# Patient Record
Sex: Female | Born: 1970 | Race: Black or African American | Hispanic: No | Marital: Single | Smoking: Never smoker
Health system: Southern US, Community
[De-identification: ages and names within clinical notes are randomized; demographics above are authoritative.]

## PROBLEM LIST (undated history)

## (undated) DIAGNOSIS — I10 Essential (primary) hypertension: Secondary | ICD-10-CM

---

## 2006-03-17 ENCOUNTER — Ambulatory Visit: Payer: Self-pay | Admitting: Internal Medicine

## 2017-02-12 ENCOUNTER — Encounter: Payer: Self-pay | Admitting: Emergency Medicine

## 2017-02-12 ENCOUNTER — Emergency Department: Payer: Medicaid Other

## 2017-02-12 ENCOUNTER — Emergency Department
Admission: EM | Admit: 2017-02-12 | Discharge: 2017-02-13 | Disposition: A | Discharge status: Home / Self Care | Payer: Medicaid Other | Attending: Emergency Medicine | Admitting: Emergency Medicine

## 2017-02-12 DIAGNOSIS — T59811A Toxic effect of smoke, accidental (unintentional), initial encounter: Secondary | ICD-10-CM

## 2017-02-12 DIAGNOSIS — J705 Respiratory conditions due to smoke inhalation: Secondary | ICD-10-CM | POA: Insufficient documentation

## 2017-02-12 DIAGNOSIS — I10 Essential (primary) hypertension: Secondary | ICD-10-CM | POA: Diagnosis not present

## 2017-02-12 HISTORY — DX: Essential (primary) hypertension: I10

## 2017-02-12 LAB — COMPREHENSIVE METABOLIC PANEL
ALT: 18 U/L (ref 14–54)
ANION GAP: 9 (ref 5–15)
AST: 22 U/L (ref 15–41)
Albumin: 4.3 g/dL (ref 3.5–5.0)
Alkaline Phosphatase: 52 U/L (ref 38–126)
BUN: 11 mg/dL (ref 6–20)
CALCIUM: 9.4 mg/dL (ref 8.9–10.3)
CHLORIDE: 108 mmol/L (ref 101–111)
CO2: 23 mmol/L (ref 22–32)
Creatinine, Ser: 0.83 mg/dL (ref 0.44–1.00)
Glucose, Bld: 123 mg/dL — ABNORMAL HIGH (ref 65–99)
Potassium: 3.5 mmol/L (ref 3.5–5.1)
SODIUM: 140 mmol/L (ref 135–145)
Total Bilirubin: 0.4 mg/dL (ref 0.3–1.2)
Total Protein: 8.3 g/dL — ABNORMAL HIGH (ref 6.5–8.1)

## 2017-02-12 LAB — CBC WITH DIFFERENTIAL/PLATELET
BASOS PCT: 1 %
Basophils Absolute: 0.1 10*3/uL (ref 0–0.1)
Eosinophils Absolute: 0.2 10*3/uL (ref 0–0.7)
Eosinophils Relative: 2 %
HEMATOCRIT: 37.9 % (ref 35.0–47.0)
HEMOGLOBIN: 12.6 g/dL (ref 12.0–16.0)
LYMPHS ABS: 1.7 10*3/uL (ref 1.0–3.6)
Lymphocytes Relative: 20 %
MCH: 26.2 pg (ref 26.0–34.0)
MCHC: 33.1 g/dL (ref 32.0–36.0)
MCV: 79.1 fL — ABNORMAL LOW (ref 80.0–100.0)
MONOS PCT: 6 %
Monocytes Absolute: 0.5 10*3/uL (ref 0.2–0.9)
NEUTROS ABS: 5.9 10*3/uL (ref 1.4–6.5)
NEUTROS PCT: 71 %
Platelets: 285 10*3/uL (ref 150–440)
RBC: 4.79 MIL/uL (ref 3.80–5.20)
RDW: 14.3 % (ref 11.5–14.5)
WBC: 8.3 10*3/uL (ref 3.6–11.0)
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
## 2017-02-13 LAB — BLOOD GAS, ARTERIAL
Acid-base deficit: 1.1 mmol/L (ref 0.0–2.0)
Bicarbonate: 23.5 mmol/L (ref 20.0–28.0)
FIO2: 0.21
LHR: 16 {breaths}/min
O2 Saturation: 99 %
PATIENT TEMPERATURE: 37
PH ART: 7.4 (ref 7.350–7.450)
PO2 ART: 131 mmHg — AB (ref 83.0–108.0)
pCO2 arterial: 38 mmHg (ref 32.0–48.0)

## 2017-02-13 LAB — COOXEMETRY PANEL
Carboxyhemoglobin: 2.2 % — ABNORMAL HIGH (ref 0.5–1.5)
METHEMOGLOBIN: 1.2 % (ref 0.0–1.5)
O2 SAT: 98.7 %
TOTAL OXYGEN CONTENT: 95 mL/dL

## 2017-02-13 NOTE — Discharge Instructions (Signed)
Your workup in the Emergency Department today was reassuring.  We did not find any severe abnormalities.  We recommend you drink plenty of fluids, take your regular medications and/or any new ones prescribed today, and follow up with the doctor(s) listed in these documents as recommended.  Return to the Emergency Department if you develop new or worsening symptoms that concern you.
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
## 2018-03-25 ENCOUNTER — Other Ambulatory Visit: Payer: Self-pay | Admitting: Family Medicine

## 2018-03-25 DIAGNOSIS — Z1231 Encounter for screening mammogram for malignant neoplasm of breast: Secondary | ICD-10-CM

## 2018-03-30 ENCOUNTER — Inpatient Hospital Stay: Admission: RE | Admit: 2018-03-30 | Payer: Self-pay | Source: Ambulatory Visit

## 2018-07-29 ENCOUNTER — Ambulatory Visit
Admission: RE | Admit: 2018-07-29 | Discharge: 2018-07-29 | Disposition: A | Discharge status: Home / Self Care | Payer: Medicaid Other | Source: Ambulatory Visit | Attending: Family Medicine | Admitting: Family Medicine

## 2018-07-29 DIAGNOSIS — Z1231 Encounter for screening mammogram for malignant neoplasm of breast: Secondary | ICD-10-CM | POA: Insufficient documentation

## 2018-07-30 ENCOUNTER — Other Ambulatory Visit: Payer: Self-pay | Admitting: Family Medicine

## 2018-07-30 DIAGNOSIS — R928 Other abnormal and inconclusive findings on diagnostic imaging of breast: Secondary | ICD-10-CM

## 2018-07-30 DIAGNOSIS — N6489 Other specified disorders of breast: Secondary | ICD-10-CM

## 2018-08-06 ENCOUNTER — Ambulatory Visit
Admission: RE | Admit: 2018-08-06 | Discharge: 2018-08-06 | Disposition: A | Discharge status: Home / Self Care | Payer: Medicaid Other | Source: Ambulatory Visit | Attending: Family Medicine | Admitting: Family Medicine

## 2018-08-06 DIAGNOSIS — N6489 Other specified disorders of breast: Secondary | ICD-10-CM

## 2018-08-06 DIAGNOSIS — R928 Other abnormal and inconclusive findings on diagnostic imaging of breast: Secondary | ICD-10-CM | POA: Diagnosis not present

## 2018-08-11 ENCOUNTER — Other Ambulatory Visit: Payer: Self-pay | Admitting: Family Medicine

## 2018-08-11 DIAGNOSIS — R928 Other abnormal and inconclusive findings on diagnostic imaging of breast: Secondary | ICD-10-CM

## 2018-08-11 DIAGNOSIS — N6310 Unspecified lump in the right breast, unspecified quadrant: Secondary | ICD-10-CM

## 2018-08-13 ENCOUNTER — Ambulatory Visit
Admission: RE | Admit: 2018-08-13 | Discharge: 2018-08-13 | Disposition: A | Discharge status: Home / Self Care | Payer: Medicaid Other | Source: Ambulatory Visit | Attending: Family Medicine | Admitting: Family Medicine

## 2018-08-13 DIAGNOSIS — R928 Other abnormal and inconclusive findings on diagnostic imaging of breast: Secondary | ICD-10-CM

## 2018-08-13 DIAGNOSIS — N6310 Unspecified lump in the right breast, unspecified quadrant: Secondary | ICD-10-CM | POA: Diagnosis present

## 2018-08-13 DIAGNOSIS — N6311 Unspecified lump in the right breast, upper outer quadrant: Secondary | ICD-10-CM | POA: Diagnosis not present

## 2018-08-13 HISTORY — PX: BREAST BIOPSY: SHX20

## 2018-08-16 LAB — SURGICAL PATHOLOGY

## 2020-01-18 ENCOUNTER — Ambulatory Visit
Admission: RE | Admit: 2020-01-18 | Discharge: 2020-01-18 | Disposition: A | Discharge status: Home / Self Care | Payer: Self-pay | Source: Ambulatory Visit | Attending: Oncology | Admitting: Oncology

## 2020-01-18 ENCOUNTER — Encounter: Payer: Self-pay | Admitting: *Deleted

## 2020-01-18 ENCOUNTER — Other Ambulatory Visit: Payer: Self-pay

## 2020-01-18 ENCOUNTER — Ambulatory Visit: Payer: Self-pay | Attending: Oncology | Admitting: *Deleted

## 2020-01-18 VITALS — BP 118/87 | HR 108 | Temp 97.8°F | Ht 61.75 in | Wt 148.4 lb

## 2020-01-18 DIAGNOSIS — N63 Unspecified lump in unspecified breast: Secondary | ICD-10-CM | POA: Insufficient documentation

## 2020-01-18 NOTE — Progress Notes (Signed)
Letter mailed from [facility] to inform patient of her normal mammogram results.  Patient is to follow-up with annual diagnostic mammogram in one year.  Patient was placed on my calendar in January to set up next [facility] / mammo appointment for June of 2022, since the breast center does not schedule out more than 6 months.

## 2020-01-18 NOTE — Progress Notes (Signed)
  Subjective:     Patient ID: Meredith Roberts, female   DOB: Jul 02, 1971, 49 y.o.   MRN: [id number]  HPI   Review of Systems     Objective:   Physical Exam Chest:     Breasts:        Right: No swelling, bleeding, inverted nipple, nipple discharge, skin change or tenderness.        Left: No swelling, bleeding, inverted nipple, mass, nipple discharge, skin change or tenderness.    Abdominal:    Lymphadenopathy:     Upper Body:     Right upper body: No supraclavicular or axillary adenopathy.     Left upper body: No supraclavicular adenopathy.  Skin:            Assessment:     49 year old Black female presents to [facility] for clinical breast exam and mammogram only.  Clinical breast exam reveals a tiny less than 0.5 cm firm nodule at 1:00 in the right areola.  Patient had right breast biopsy in 2020 that does not correlate with the area of palpable concern.  Taught self breast awareness.  Patient with a notable umbilical hernia.  She states she has an appointment in [place] for a consult about her hernia.  Last pap at her PCP in 2020.  Patient states she has another appointment for her pap smear in 3 months.  Patient has been screened for eligibility.  She does not have any insurance, Medicare or Medicaid.  She also meets financial eligibility.   Risk Assessment    Risk Scores      01/18/2020   Last edited by: Alta Corning, CMA   5-year risk: 1.7 %   Lifetime risk: 14.1 %             Plan:     Bilateral diagnostic mammogram and ultrasound ordered for follow up right breast mass and new nodule on exam.  Will follow up per [facility]CP protocol.

## 2020-01-18 NOTE — Patient Instructions (Signed)
Gave patient hand-out, Women Staying Healthy, Active and Well from [facility]CP, with education on breast health, pap smears, heart and colon health. 

## 2020-08-29 ENCOUNTER — Encounter: Payer: Self-pay | Admitting: *Deleted

## 2020-08-29 ENCOUNTER — Other Ambulatory Visit: Payer: Self-pay | Admitting: *Deleted

## 2020-08-29 DIAGNOSIS — N63 Unspecified lump in unspecified breast: Secondary | ICD-10-CM

## 2020-09-25 ENCOUNTER — Other Ambulatory Visit: Payer: Self-pay

## 2020-09-25 ENCOUNTER — Ambulatory Visit: Admission: EM | Admit: 2020-09-25 | Discharge: 2020-09-25 | Payer: Self-pay

## 2020-09-25 ENCOUNTER — Ambulatory Visit (INDEPENDENT_AMBULATORY_CARE_PROVIDER_SITE_OTHER): Payer: Self-pay

## 2020-09-25 DIAGNOSIS — R109 Unspecified abdominal pain: Secondary | ICD-10-CM

## 2020-09-25 DIAGNOSIS — R1084 Generalized abdominal pain: Secondary | ICD-10-CM | POA: Insufficient documentation

## 2020-09-25 LAB — CBC WITH DIFFERENTIAL/PLATELET
Abs Immature Granulocytes: 0.01 10*3/uL (ref 0.00–0.07)
Basophils Absolute: 0.1 10*3/uL (ref 0.0–0.1)
Basophils Relative: 1 %
Eosinophils Absolute: 0.1 10*3/uL (ref 0.0–0.5)
Eosinophils Relative: 1 %
HCT: 35.7 % — ABNORMAL LOW (ref 36.0–46.0)
Hemoglobin: 11.6 g/dL — ABNORMAL LOW (ref 12.0–15.0)
Immature Granulocytes: 0 %
Lymphocytes Relative: 21 %
Lymphs Abs: 1.3 10*3/uL (ref 0.7–4.0)
MCH: 23.5 pg — ABNORMAL LOW (ref 26.0–34.0)
MCHC: 32.5 g/dL (ref 30.0–36.0)
MCV: 72.3 fL — ABNORMAL LOW (ref 80.0–100.0)
Monocytes Absolute: 0.4 10*3/uL (ref 0.1–1.0)
Monocytes Relative: 6 %
Neutro Abs: 4.3 10*3/uL (ref 1.7–7.7)
Neutrophils Relative %: 71 %
Platelets: 304 10*3/uL (ref 150–400)
RBC: 4.94 MIL/uL (ref 3.87–5.11)
RDW: 17.4 % — ABNORMAL HIGH (ref 11.5–15.5)
WBC: 6.1 10*3/uL (ref 4.0–10.5)
nRBC: 0 % (ref 0.0–0.2)

## 2020-09-25 LAB — COMPREHENSIVE METABOLIC PANEL
ALT: 17 U/L (ref 0–44)
AST: 19 U/L (ref 15–41)
Albumin: 4.2 g/dL (ref 3.5–5.0)
Alkaline Phosphatase: 61 U/L (ref 38–126)
Anion gap: 11 (ref 5–15)
BUN: 14 mg/dL (ref 6–20)
CO2: 27 mmol/L (ref 22–32)
Calcium: 9.2 mg/dL (ref 8.9–10.3)
Chloride: 97 mmol/L — ABNORMAL LOW (ref 98–111)
Creatinine, Ser: 0.63 mg/dL (ref 0.44–1.00)
GFR, Estimated: >60 mL/min (ref >60)
Glucose, Bld: 106 mg/dL — ABNORMAL HIGH (ref 70–99)
Potassium: 3.5 mmol/L (ref 3.5–5.1)
Sodium: 135 mmol/L (ref 135–145)
Total Bilirubin: 0.4 mg/dL (ref 0.3–1.2)
Total Protein: 8.2 g/dL — ABNORMAL HIGH (ref 6.5–8.1)

## 2020-09-25 NOTE — Discharge Instructions (Addendum)
As we discussed your x-ray is concerning for possible colitis versus ileus versus small bowel obstruction and you need further evaluation and admission to the hospital.  Since your surgeons are with [facility] I am recommending you go to the emergency department at [facility] for evaluation.
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
## 2020-09-25 NOTE — ED Triage Notes (Signed)
Patient complains of lower abdominal pain that started last night. Denies diarrhea, vomiting, urinary frequency and urgency.

## 2021-01-21 NOTE — Progress Notes (Signed)
Patient pre-screened for [facility] eligibility.  Two patient identifiers used for verification that I was speaking to correct patient.  Patient to Present directly to [facility] 01/22/21 at 2:00 for [facility] diagnostic follow-up mammogram.

## 2021-01-22 ENCOUNTER — Ambulatory Visit
Admission: RE | Admit: 2021-01-22 | Discharge: 2021-01-22 | Disposition: A | Discharge status: Home / Self Care | Payer: Medicaid Other | Source: Ambulatory Visit | Attending: Oncology | Admitting: Oncology

## 2021-01-22 ENCOUNTER — Encounter: Payer: Self-pay | Admitting: *Deleted

## 2021-01-22 ENCOUNTER — Ambulatory Visit: Payer: Medicaid Other | Attending: Oncology | Admitting: *Deleted

## 2021-01-22 ENCOUNTER — Other Ambulatory Visit: Payer: Self-pay

## 2021-01-22 DIAGNOSIS — N63 Unspecified lump in unspecified breast: Secondary | ICD-10-CM

## 2021-01-22 NOTE — Progress Notes (Signed)
Patient pre-screened for [facility] by Coralee Rud, RN.  Patient presented to [facility] for 1 year follow up mammogram and ultrasound for a right breast mass.  Will follow up per [facility] protocol.

## 2021-01-23 ENCOUNTER — Encounter: Payer: Self-pay | Admitting: *Deleted

## 2021-01-23 NOTE — Progress Notes (Signed)
Letter mailed from [facility] to inform patient of her normal mammogram results.  Patient is to follow-up with annual screening in one year. 

## 2021-02-07 ENCOUNTER — Encounter: Payer: Self-pay | Admitting: *Deleted

## 2021-08-01 ENCOUNTER — Encounter (HOSPITAL_COMMUNITY): Payer: Self-pay | Admitting: Emergency Medicine

## 2021-08-01 ENCOUNTER — Emergency Department (HOSPITAL_COMMUNITY): Payer: No Typology Code available for payment source

## 2021-08-01 ENCOUNTER — Emergency Department (HOSPITAL_COMMUNITY)
Admission: EM | Admit: 2021-08-01 | Discharge: 2021-08-01 | Disposition: A | Discharge status: Home / Self Care | Payer: No Typology Code available for payment source | Attending: Emergency Medicine | Admitting: Emergency Medicine

## 2021-08-01 ENCOUNTER — Other Ambulatory Visit: Payer: Self-pay

## 2021-08-01 DIAGNOSIS — I10 Essential (primary) hypertension: Secondary | ICD-10-CM | POA: Insufficient documentation

## 2021-08-01 DIAGNOSIS — Y92410 Unspecified street and highway as the place of occurrence of the external cause: Secondary | ICD-10-CM | POA: Diagnosis not present

## 2021-08-01 DIAGNOSIS — R0789 Other chest pain: Secondary | ICD-10-CM | POA: Diagnosis not present

## 2021-08-01 DIAGNOSIS — Z79899 Other long term (current) drug therapy: Secondary | ICD-10-CM | POA: Diagnosis not present

## 2021-08-01 MED ORDER — ALBUTEROL SULFATE HFA 108 (90 BASE) MCG/ACT IN AERS: 2.0000 | INHALATION_SPRAY | RESPIRATORY_TRACT | 3 refills | Status: AC | PRN

## 2021-08-01 MED ORDER — ACETAMINOPHEN 325 MG PO TABS
650.0000 mg | ORAL_TABLET | Freq: Once | ORAL | Status: AC
  Administered 2021-08-01: 22:00:00 650 mg via ORAL
  Filled 2021-08-01: qty 2
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
## 2021-08-01 NOTE — ED Notes (Signed)
ED Provider at bedside. 

## 2021-08-01 NOTE — ED Notes (Signed)
Patient transported to X-ray 

## 2021-08-21 ENCOUNTER — Emergency Department: Payer: Self-pay

## 2021-08-21 ENCOUNTER — Other Ambulatory Visit: Payer: Self-pay

## 2021-08-21 ENCOUNTER — Encounter: Payer: Self-pay | Admitting: Emergency Medicine

## 2021-08-21 ENCOUNTER — Emergency Department
Admission: EM | Admit: 2021-08-21 | Discharge: 2021-08-21 | Disposition: A | Discharge status: Home / Self Care | Payer: Self-pay | Attending: Emergency Medicine | Admitting: Emergency Medicine

## 2021-08-21 DIAGNOSIS — D72829 Elevated white blood cell count, unspecified: Secondary | ICD-10-CM | POA: Insufficient documentation

## 2021-08-21 DIAGNOSIS — N3001 Acute cystitis with hematuria: Secondary | ICD-10-CM | POA: Insufficient documentation

## 2021-08-21 DIAGNOSIS — R109 Unspecified abdominal pain: Secondary | ICD-10-CM

## 2021-08-21 LAB — CBC
HCT: 31.8 % — ABNORMAL LOW (ref 36.0–46.0)
Hemoglobin: 10.3 g/dL — ABNORMAL LOW (ref 12.0–15.0)
MCH: 23.5 pg — ABNORMAL LOW (ref 26.0–34.0)
MCHC: 32.4 g/dL (ref 30.0–36.0)
MCV: 72.6 fL — ABNORMAL LOW (ref 80.0–100.0)
Platelets: 376 10*3/uL (ref 150–400)
RBC: 4.38 MIL/uL (ref 3.87–5.11)
RDW: 17 % — ABNORMAL HIGH (ref 11.5–15.5)
WBC: 15.7 10*3/uL — ABNORMAL HIGH (ref 4.0–10.5)
nRBC: 0 % (ref 0.0–0.2)

## 2021-08-21 LAB — URINALYSIS, ROUTINE W REFLEX MICROSCOPIC
Bilirubin Urine: NEGATIVE
Glucose, UA: NEGATIVE mg/dL
Ketones, ur: NEGATIVE mg/dL
Nitrite: NEGATIVE
Protein, ur: 30 mg/dL — AB
Specific Gravity, Urine: 1.01 (ref 1.005–1.030)
WBC, UA: >50 WBC/hpf — ABNORMAL HIGH (ref 0–5)
pH: 5 (ref 5.0–8.0)

## 2021-08-21 LAB — COMPREHENSIVE METABOLIC PANEL
ALT: 19 U/L (ref 0–44)
AST: 19 U/L (ref 15–41)
Albumin: 3.9 g/dL (ref 3.5–5.0)
Alkaline Phosphatase: 58 U/L (ref 38–126)
Anion gap: 5 (ref 5–15)
BUN: 6 mg/dL (ref 6–20)
CO2: 25 mmol/L (ref 22–32)
Calcium: 8.7 mg/dL — ABNORMAL LOW (ref 8.9–10.3)
Chloride: 105 mmol/L (ref 98–111)
Creatinine, Ser: 0.67 mg/dL (ref 0.44–1.00)
GFR, Estimated: >60 mL/min (ref >60)
Glucose, Bld: 103 mg/dL — ABNORMAL HIGH (ref 70–99)
Potassium: 3.2 mmol/L — ABNORMAL LOW (ref 3.5–5.1)
Sodium: 135 mmol/L (ref 135–145)
Total Bilirubin: 0.7 mg/dL (ref 0.3–1.2)
Total Protein: 8 g/dL (ref 6.5–8.1)

## 2021-08-21 LAB — POC URINE PREG, ED: Preg Test, Ur: NEGATIVE

## 2021-08-21 LAB — LACTIC ACID, PLASMA: Lactic Acid, Venous: 0.7 mmol/L (ref 0.5–1.9)

## 2021-08-21 LAB — LIPASE, BLOOD: Lipase: 27 U/L (ref 11–51)

## 2021-08-21 MED ORDER — NAPROXEN 500 MG PO TABS: 500.0000 mg | ORAL_TABLET | Freq: Two times a day (BID) | ORAL | 0 refills | Status: AC

## 2021-08-21 MED ORDER — KETOROLAC TROMETHAMINE 30 MG/ML IJ SOLN
15.0000 mg | Freq: Once | INTRAMUSCULAR | Status: AC
  Administered 2021-08-21: 15 mg via INTRAVENOUS
  Filled 2021-08-21: qty 1

## 2021-08-21 MED ORDER — SODIUM CHLORIDE 0.9 % IV BOLUS
1000.0000 mL | Freq: Once | INTRAVENOUS | Status: AC
  Administered 2021-08-21: 1000 mL via INTRAVENOUS

## 2021-08-21 MED ORDER — SODIUM CHLORIDE 0.9 % IV SOLN
1.0000 g | Freq: Once | INTRAVENOUS | Status: AC
Start: 2021-08-21 — End: 2021-08-21
  Administered 2021-08-21: 1 g via INTRAVENOUS
  Filled 2021-08-21: qty 10

## 2021-08-21 MED ORDER — CEPHALEXIN 500 MG PO CAPS: 500.0000 mg | ORAL_CAPSULE | Freq: Two times a day (BID) | ORAL | 0 refills | Status: AC

## 2021-08-21 NOTE — Discharge Instructions (Addendum)
Please follow-up with primary care provider of your choice in about 2 weeks to ensure that the infection is cleared.  Return to the emergency department for symptoms of change or worsen if you are unable to schedule appointment.

## 2021-08-21 NOTE — ED Notes (Addendum)
Patient Alert and oriented to baseline. Stable and ambulatory to baseline. Patient verbalized understanding of the discharge instructions.  Patient belongings were taken by the patient.   

## 2021-08-21 NOTE — ED Provider Notes (Signed)
[facility] Provider Note    Event Date/Time   First MD Initiated Contact with Patient 08/21/21 1123     (approximate)   History   Abdominal Pain   HPI  Meredith Roberts is a 51 y.o. female presents to the emergency department for treatment and evaluation of lower abdominal pain and right side flank pain.  Symptoms started today.  She denies fever.  She denies nausea, vomiting, diarrhea, she denies vaginal discharge or concern for STI.  She denies dysuria.  No previous kidney stone or frequent UTI.     Physical Exam   Triage Vital Signs: ED Triage Vitals  Enc Vitals Group     BP 08/21/21 1000 139/89     Pulse Rate 08/21/21 1000 (!) 126     Resp 08/21/21 1000 16     Temp 08/21/21 1000 98.4 F (36.9 C)     Temp Source 08/21/21 1000 Oral     SpO2 08/21/21 1000 97 %     Weight 08/21/21 0958 142 lb 12.8 oz (64.8 kg)     Height 08/21/21 0958 5\' 1"  (1.549 m)     Head Circumference --      Peak Flow --      Pain Score 08/21/21 0958 6     Pain Loc --      Pain Edu? --      Excl. in GC? --     Most recent vital signs: Vitals:   08/21/21 1243 08/21/21 1330  BP: (!) 154/97 (!) 132/57  Pulse: (!) 102   Resp: 16   Temp:    SpO2: 98%      General: Awake, no distress.  CV:  Good peripheral perfusion.  Resp:  Normal effort.  Abd:  No distention.  Other:  No CVA tenderness on exam.  Mild tenderness over the suprapubic area.  No focal tenderness of the abdomen.  No rebound tenderness.   ED Results / Procedures / Treatments   Labs (all labs ordered are listed, but only abnormal results are displayed) Labs Reviewed  COMPREHENSIVE METABOLIC PANEL - Abnormal; Notable for the following components:      Result Value   Potassium 3.2 (*)    Glucose, Bld 103 (*)    Calcium 8.7 (*)    All other components within normal limits  CBC - Abnormal; Notable for the following components:   WBC 15.7 (*)    Hemoglobin 10.3 (*)    HCT 31.8 (*)    MCV 72.6  (*)    MCH 23.5 (*)    RDW 17.0 (*)    All other components within normal limits  URINALYSIS, ROUTINE W REFLEX MICROSCOPIC - Abnormal; Notable for the following components:   Color, Urine YELLOW (*)    APPearance CLOUDY (*)    Hgb urine dipstick LARGE (*)    Protein, ur 30 (*)    Leukocytes,Ua LARGE (*)    WBC, UA >50 (*)    Bacteria, UA RARE (*)    All other components within normal limits  LIPASE, BLOOD  LACTIC ACID, PLASMA  LACTIC ACID, PLASMA  POC URINE PREG, ED     EKG  Not indicated   RADIOLOGY CT of the abdomen pelvis shows no acute intra-abdominal abnormalities.  Image and radiology report viewed by me.  PROCEDURES:  Critical Care performed: No  Procedures   MEDICATIONS ORDERED IN ED: Medications  sodium chloride 0.9 % bolus 1,000 mL (0 mLs Intravenous Stopped 08/21/21 1353)  cefTRIAXone (ROCEPHIN) 1 g in sodium chloride 0.9 % 100 mL IVPB (0 g Intravenous Stopped 08/21/21 1222)  ketorolac (TORADOL) 30 MG/ML injection 15 mg (15 mg Intravenous Given 08/21/21 1353)     IMPRESSION / MDM / ASSESSMENT AND PLAN / ED COURSE  I reviewed the triage vital signs and the nursing notes.  Differential diagnosis includes, but is not limited to, UTI, kidney stone, pyelonephritis, diverticulitis, PID  51 year old female presenting to the emergency department for treatment and evaluation of suprapubic pain and right flank pain.  See HPI for further details.  While awaiting ER room assignment, labs were drawn.  She has a leukocytosis of 15.7--Rocephin 1 g IV ordered, asymptomatic anemia with a hemoglobin of 10.3 and hematocrit of 31.8, urinalysis shows large amount of hemoglobin, protein, large leukocytes, greater than 50 white blood cells, rare bacteria.  Lipase is normal.  Vital signs show she is tachycardic at 126.  Lactic acid ordered and fluid bolus.  She is afebrile.  Respirations are normal and oxygen saturation is stable at 97% on room air.  CT renal stone study ordered  due to flank pain and large amount of hemoglobin in the urine.  CT is negative for acute concerns.  Results discussed with the patient.  Plan will be to discharge her home with a prescription for Keflex and Naprosyn.  She is to have a repeat urinalysis in about 2 weeks to make sure infection has cleared.  She is to return to the emergency department for symptoms of change or worsen if she is unable to schedule an appointment.      FINAL CLINICAL IMPRESSION(S) / ED DIAGNOSES   Final diagnoses:  Flank pain, acute  Acute cystitis with hematuria     Rx / DC Orders   ED Discharge Orders          Ordered    cephALEXin (KEFLEX) 500 MG capsule  2 times daily        08/21/21 1340    naproxen (NAPROSYN) 500 MG tablet  2 times daily with meals        08/21/21 1340             Note:  This document was prepared using Dragon voice recognition software and may include unintentional dictation errors.   Victorino Dike, FNP 08/21/21 1609    Naaman Plummer, MD 08/22/21 [phone]

## 2021-08-21 NOTE — ED Triage Notes (Signed)
Pt comes into the ED via POV c/o lower abd pain that started today.  Pt denies any N/V/D.  Pt in NAD at this time with even and unlabored respirations.

## 2021-09-10 IMAGING — US US BREAST*R* LIMITED INC AXILLA
1 series · 8 of 8 positions shown · non-contrast
Comparison: Previous exam(s).

CLINICAL DATA: Two year followup for probably benign masses in the
RIGHT breast. Patient had ultrasound-guided core biopsy of mass in
the 10 o'clock location of the RIGHT breast 3 centimeters from the
nipple on 08/13/2018, suggesting complex fibroadenoma. Two
additional benign masses have been followed.

EXAM:
DIGITAL DIAGNOSTIC BILATERAL MAMMOGRAM WITH TOMOSYNTHESIS AND CAD;
ULTRASOUND RIGHT BREAST LIMITED
TECHNIQUE: Bilateral digital diagnostic mammography and breast tomosynthesis
was performed. The images were evaluated with computer-aided
detection.; Targeted ultrasound examination of the right breast was
performed

[Series 1: us breast*right* limited inc axilla · 0.05mm/px · 8 of 8 slices shown]
[im 1/8]
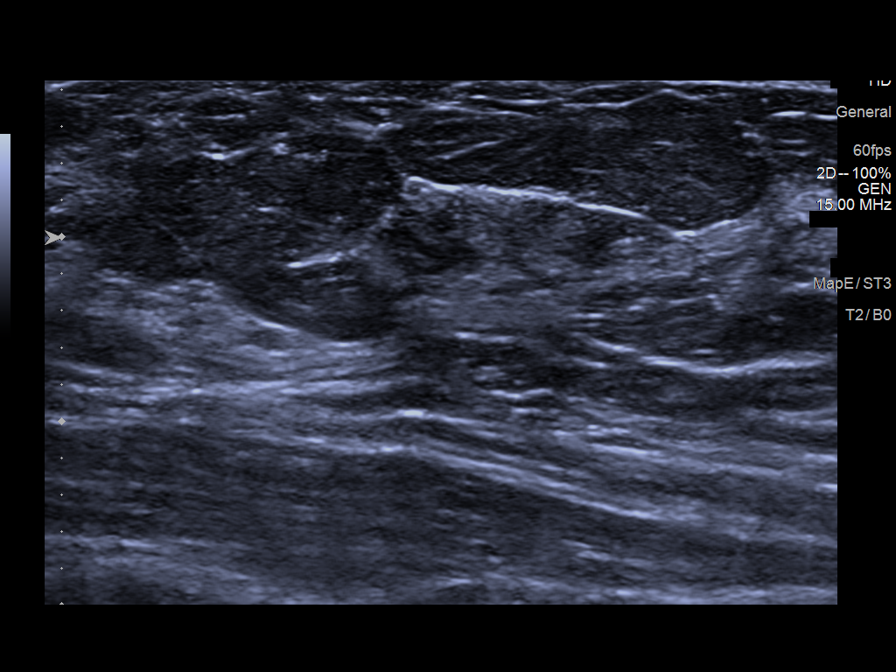
[im 2/8]
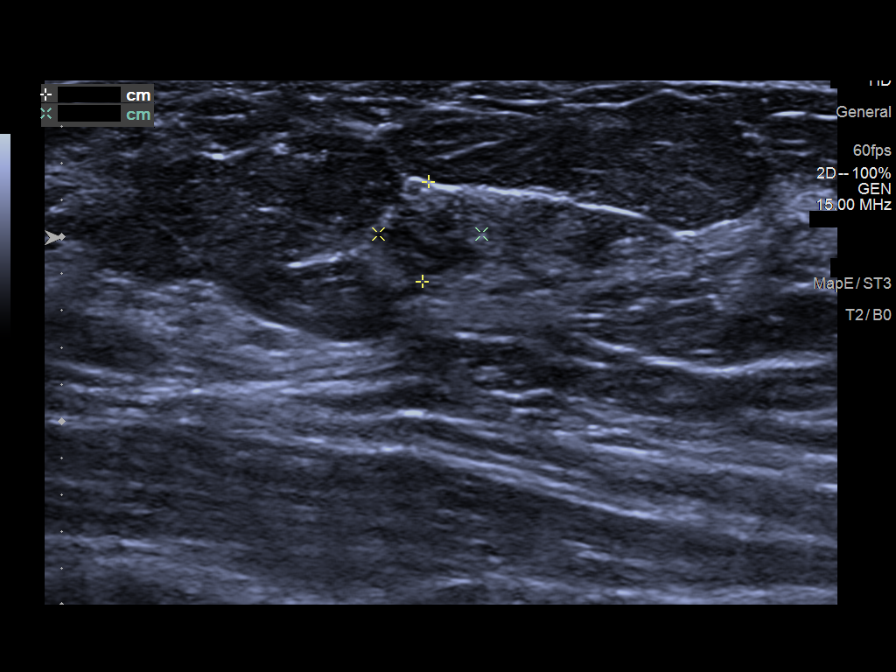
[im 3/8]
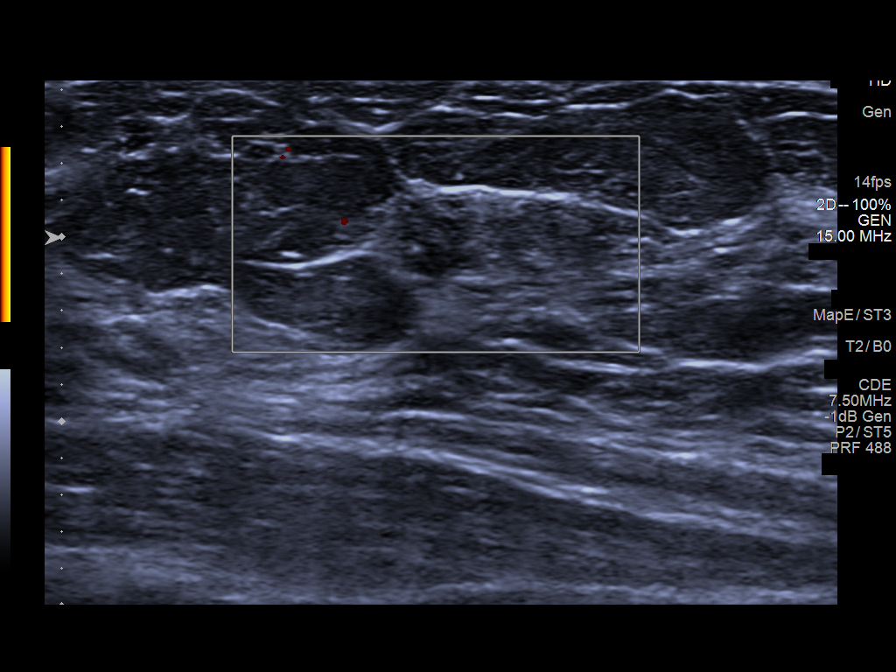
[im 4/8]
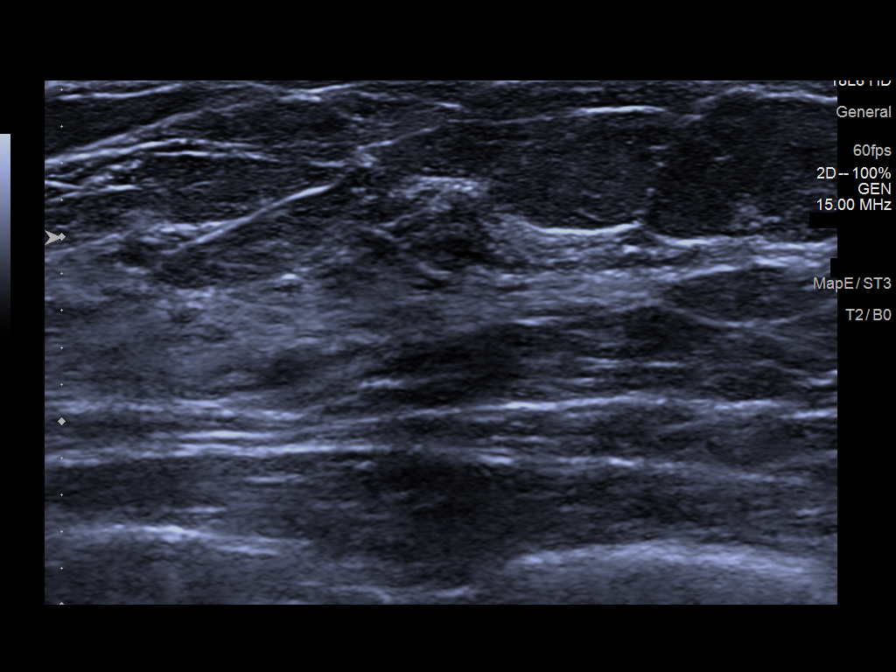
[im 5/8]
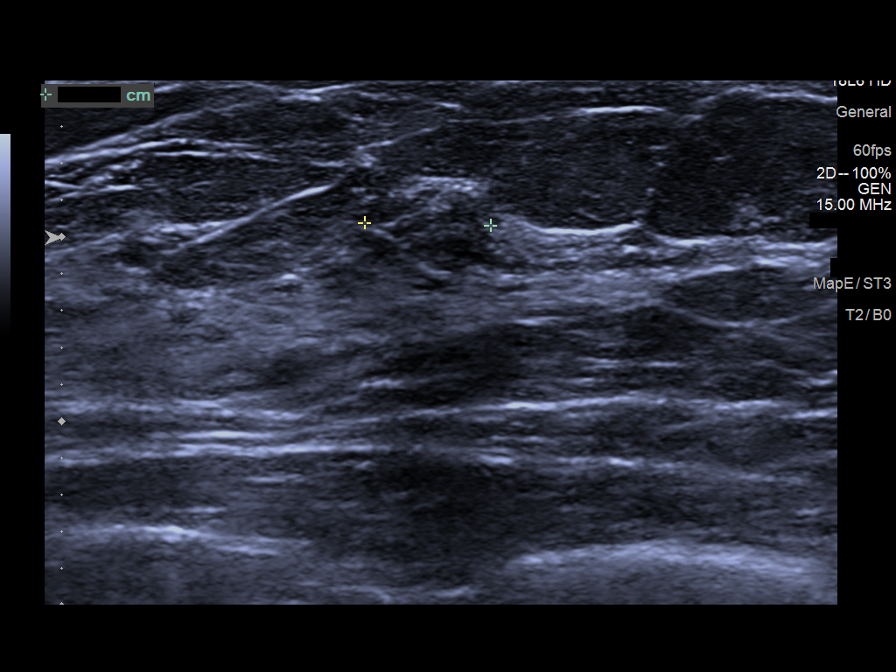
[im 6/8]
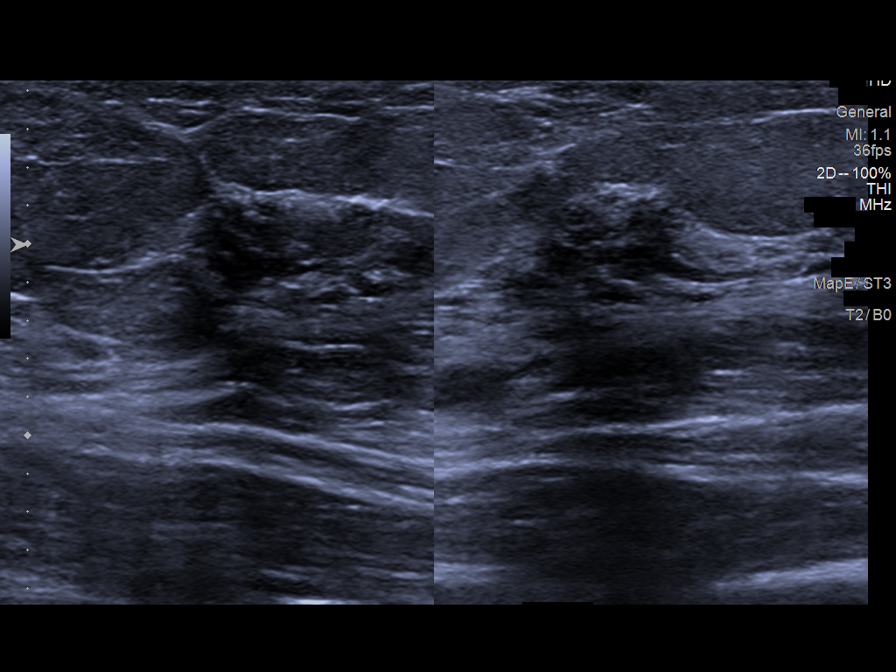
[im 7/8]
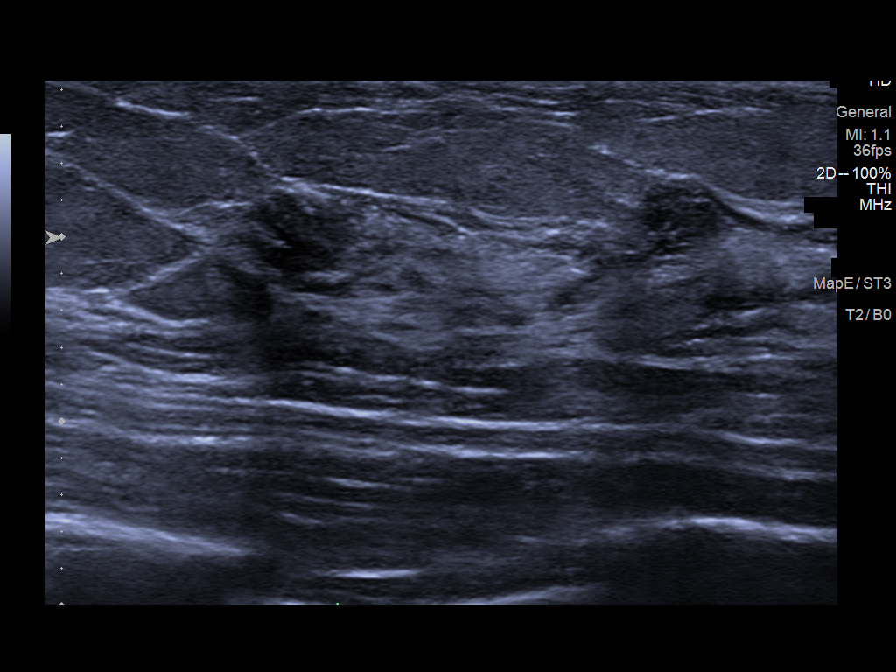
[im 8/8]
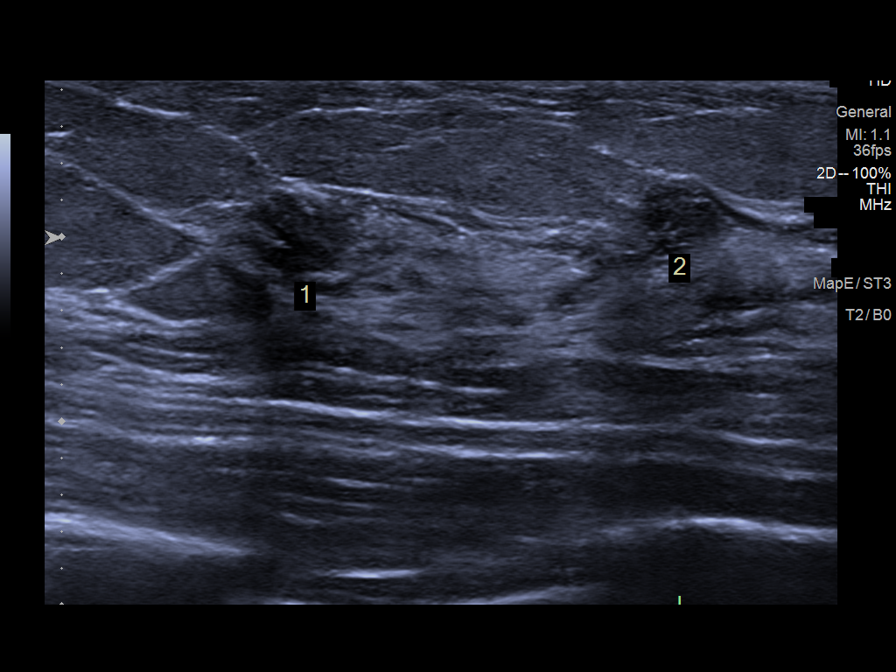

[8 of 8 positions shown; findings below may reference images not displayed]

ACR Breast Density Category c: The breast tissue is heterogeneously
dense, which may obscure small masses.
FINDINGS: Tissue marker clip is identified in the LATERAL portion of the RIGHT
breast. No suspicious mass, distortion, or microcalcifications are
identified in either breast to suggest presence of malignancy.

Targeted ultrasound is performed, showing a circumscribed oval
isoechoic mass in the 10 o'clock location of the RIGHT breast 3
centimeters from the nipple, measuring 0.7 x 0.6 x 0.5 centimeters.
Previously, mass measured 0.8 x 0.5 x 0.7 centimeters. An adjacent
benign-appearing hypoechoic mass is 4 millimeters in diameter. The
mass previously noted in the 12 o'clock location of the RIGHT breast
is not seen on today's exam.
IMPRESSION: 1.  No mammographic or ultrasound evidence for malignancy.
2. Long-term stability of RIGHT breast masses, consistent with
benign process.

RECOMMENDATION:
Screening mammogram in one year.(Code:[id number]-E-2N4)

I have discussed the findings and recommendations with the patient.
If applicable, a reminder letter will be sent to the patient
regarding the next appointment.

BI-RADS CATEGORY  2: Benign.

## 2021-12-02 ENCOUNTER — Other Ambulatory Visit: Payer: Self-pay

## 2021-12-02 ENCOUNTER — Emergency Department
Admission: EM | Admit: 2021-12-02 | Discharge: 2021-12-02 | Disposition: A | Discharge status: Home / Self Care | Payer: Medicaid Other | Attending: Emergency Medicine | Admitting: Emergency Medicine

## 2021-12-02 ENCOUNTER — Emergency Department: Payer: Medicaid Other

## 2021-12-02 DIAGNOSIS — I10 Essential (primary) hypertension: Secondary | ICD-10-CM | POA: Insufficient documentation

## 2021-12-02 DIAGNOSIS — G43909 Migraine, unspecified, not intractable, without status migrainosus: Secondary | ICD-10-CM

## 2021-12-02 DIAGNOSIS — R519 Headache, unspecified: Secondary | ICD-10-CM | POA: Insufficient documentation

## 2021-12-02 MED ORDER — METOCLOPRAMIDE HCL 5 MG/ML IJ SOLN
10.0000 mg | Freq: Once | INTRAMUSCULAR | Status: AC
Start: 2021-12-02 — End: 2021-12-02
  Administered 2021-12-02: 10 mg via INTRAVENOUS
  Filled 2021-12-02: qty 2

## 2021-12-02 MED ORDER — DIPHENHYDRAMINE HCL 50 MG/ML IJ SOLN
50.0000 mg | Freq: Once | INTRAMUSCULAR | Status: AC
  Administered 2021-12-02: 50 mg via INTRAVENOUS
  Filled 2021-12-02: qty 1

## 2021-12-02 MED ORDER — KETOROLAC TROMETHAMINE 30 MG/ML IJ SOLN
30.0000 mg | Freq: Once | INTRAMUSCULAR | Status: AC
Start: 2021-12-02 — End: 2021-12-02
  Administered 2021-12-02: 30 mg via INTRAVENOUS
  Filled 2021-12-02: qty 1

## 2021-12-02 MED ORDER — SODIUM CHLORIDE 0.9 % IV BOLUS
1000.0000 mL | Freq: Once | INTRAVENOUS | Status: AC
Start: 2021-12-02 — End: 2021-12-02
  Administered 2021-12-02: 1000 mL via INTRAVENOUS

## 2021-12-02 MED ORDER — BUTALBITAL-APAP-CAFFEINE 50-325-40 MG PO TABS
1.0000 | ORAL_TABLET | Freq: Four times a day (QID) | ORAL | 0 refills | Status: AC | PRN
Start: 2021-12-02 — End: 2022-12-02

## 2021-12-02 NOTE — ED Triage Notes (Signed)
Pt c/o HA since Friday,  states she has been taking IBU with some relief. Pt is in NAD at present. Ambulatory with a steady gait ?

## 2021-12-02 NOTE — ED Provider Notes (Signed)
? ?[facility] ?Provider Note ? ? ? Event Date/Time  ? First MD Initiated Contact with Patient 12/02/21 1055   ?  (approximate) ? ?History  ? ?Chief Complaint: Headache ? ?HPI ? ?Meredith Roberts is a 51 y.o. female with a past medical history of hypertension who presents to the emergency department for a headache.  According to the patient for the past 2 days or so she has been experiencing headache along the right side of her head.  Patient denies any photo phonophobia denies any weakness or numbness of any arm or leg confusion or difficulty speaking or thinking.  Patient denies any history of significant headaches previously currently rates her headache as moderate to severe.  Patient describes more of a progressive onset of the headache but states the headache is not gone away in 2 days. ? ?Physical Exam  ? ?Triage Vital Signs: ?ED Triage Vitals  ?Enc Vitals Group  ?   BP 12/02/21 1024 (!) 160/90  ?   Pulse Rate 12/02/21 1024 73  ?   Resp 12/02/21 1024 17  ?   Temp 12/02/21 1024 98.4 ?F (36.9 ?C)  ?   Temp Source 12/02/21 1024 Oral  ?   SpO2 12/02/21 1024 99 %  ?   Weight 12/02/21 1025 141 lb (64 kg)  ?   Height 12/02/21 1025 5\' 2"  (1.575 m)  ?   Head Circumference --   ?   Peak Flow --   ?   Pain Score 12/02/21 1025 0  ?   Pain Loc --   ?   Pain Edu? --   ?   Excl. in GC? --   ? ? ?Most recent vital signs: ?Vitals:  ? 12/02/21 1024  ?BP: (!) 160/90  ?Pulse: 73  ?Resp: 17  ?Temp: 98.4 ?F (36.9 ?C)  ?SpO2: 99%  ? ? ?General: Awake, no distress.  ?CV:  Good peripheral perfusion.  Regular rate and rhythm  ?Resp:  Normal effort.  Equal breath sounds bilaterally.  ?Abd:  No distention.  Soft, nontender.  No rebound or guarding. ?Other:  Equal grip strength bilaterally.  No concerning findings on physical neurological exam ? ? ?ED Results / Procedures / Treatments  ? ?RADIOLOGY ? ?Personally evaluated the CT imaging, no concerning findings of my evaluation. ?Radiology is read the CT is  negative. ? ? ?MEDICATIONS ORDERED IN ED: ?Medications  ?ketorolac (TORADOL) 30 MG/ML injection 30 mg (has no administration in time range)  ?metoCLOPramide (REGLAN) injection 10 mg (has no administration in time range)  ?diphenhydrAMINE (BENADRYL) injection 50 mg (has no administration in time range)  ? ? ? ?IMPRESSION / MDM / ASSESSMENT AND PLAN / ED COURSE  ?I reviewed the triage vital signs and the nursing notes. ? ?Patient presents emergency department for headache x2 days with no significant headache history.  Overall the patient appears well reassuring physical and neurological exam.  We will obtain CT scan of the head to rule out intracranial mass, bleed, etc. as the patient has no history of headaches.  We will treat with a migraine cocktail of medications Toradol, Reglan, Benadryl and IV fluids.  We will reassess after imaging and cocktail administration. ? ?CT scan is negative.  Patient states her headache is improving after medications.  Patient has approximately 750 cc of normal saline remaining.  We will finish out the fluids and discharge patient with Fioricet have patient follow-up with her doctor.  Patient agreeable to plan of care. ? ?FINAL  CLINICAL IMPRESSION(S) / ED DIAGNOSES  ? ?Headache ? ? ?Note:  This document was prepared using Dragon voice recognition software and may include unintentional dictation errors. ?  Minna Antis, MD ?12/02/21 1203 ? ?

## 2022-01-06 ENCOUNTER — Emergency Department
Admission: EM | Admit: 2022-01-06 | Discharge: 2022-01-06 | Disposition: A | Discharge status: Home / Self Care | Payer: Medicaid Other | Attending: Emergency Medicine | Admitting: Emergency Medicine

## 2022-01-06 ENCOUNTER — Encounter: Payer: Self-pay | Admitting: Emergency Medicine

## 2022-01-06 ENCOUNTER — Other Ambulatory Visit: Payer: Self-pay

## 2022-01-06 DIAGNOSIS — N3000 Acute cystitis without hematuria: Secondary | ICD-10-CM | POA: Insufficient documentation

## 2022-01-06 DIAGNOSIS — E860 Dehydration: Secondary | ICD-10-CM | POA: Insufficient documentation

## 2022-01-06 DIAGNOSIS — R42 Dizziness and giddiness: Secondary | ICD-10-CM

## 2022-01-06 DIAGNOSIS — I10 Essential (primary) hypertension: Secondary | ICD-10-CM | POA: Insufficient documentation

## 2022-01-06 DIAGNOSIS — Z79899 Other long term (current) drug therapy: Secondary | ICD-10-CM | POA: Insufficient documentation

## 2022-01-06 LAB — COMPREHENSIVE METABOLIC PANEL
ALT: 32 U/L (ref 0–44)
AST: 25 U/L (ref 15–41)
Albumin: 3.9 g/dL (ref 3.5–5.0)
Alkaline Phosphatase: 53 U/L (ref 38–126)
Anion gap: 8 (ref 5–15)
BUN: 11 mg/dL (ref 6–20)
CO2: 24 mmol/L (ref 22–32)
Calcium: 9.2 mg/dL (ref 8.9–10.3)
Chloride: 104 mmol/L (ref 98–111)
Creatinine, Ser: 0.48 mg/dL (ref 0.44–1.00)
GFR, Estimated: >60 mL/min (ref >60)
Glucose, Bld: 94 mg/dL (ref 70–99)
Potassium: 3.4 mmol/L — ABNORMAL LOW (ref 3.5–5.1)
Sodium: 136 mmol/L (ref 135–145)
Total Bilirubin: 0.4 mg/dL (ref 0.3–1.2)
Total Protein: 8.8 g/dL — ABNORMAL HIGH (ref 6.5–8.1)

## 2022-01-06 LAB — CBC WITH DIFFERENTIAL/PLATELET
Abs Immature Granulocytes: 0.05 10*3/uL (ref 0.00–0.07)
Basophils Absolute: 0 10*3/uL (ref 0.0–0.1)
Basophils Relative: 1 %
Eosinophils Absolute: 0 10*3/uL (ref 0.0–0.5)
Eosinophils Relative: 0 %
HCT: 33.7 % — ABNORMAL LOW (ref 36.0–46.0)
Hemoglobin: 10.8 g/dL — ABNORMAL LOW (ref 12.0–15.0)
Immature Granulocytes: 1 %
Lymphocytes Relative: 11 %
Lymphs Abs: 0.9 10*3/uL (ref 0.7–4.0)
MCH: 22.9 pg — ABNORMAL LOW (ref 26.0–34.0)
MCHC: 32 g/dL (ref 30.0–36.0)
MCV: 71.4 fL — ABNORMAL LOW (ref 80.0–100.0)
Monocytes Absolute: 0.8 10*3/uL (ref 0.1–1.0)
Monocytes Relative: 9 %
Neutro Abs: 6.8 10*3/uL (ref 1.7–7.7)
Neutrophils Relative %: 78 %
Platelets: 305 10*3/uL (ref 150–400)
RBC: 4.72 MIL/uL (ref 3.87–5.11)
RDW: 17.4 % — ABNORMAL HIGH (ref 11.5–15.5)
WBC: 8.6 10*3/uL (ref 4.0–10.5)
nRBC: 0 % (ref 0.0–0.2)

## 2022-01-06 LAB — URINALYSIS, ROUTINE W REFLEX MICROSCOPIC
Bilirubin Urine: NEGATIVE
Glucose, UA: NEGATIVE mg/dL
Ketones, ur: 5 mg/dL — AB
Nitrite: NEGATIVE
Protein, ur: 30 mg/dL — AB
Specific Gravity, Urine: 1.014 (ref 1.005–1.030)
WBC, UA: >50 WBC/hpf — ABNORMAL HIGH (ref 0–5)
pH: 5 (ref 5.0–8.0)

## 2022-01-06 LAB — TROPONIN I (HIGH SENSITIVITY)
Troponin I (High Sensitivity): 4 ng/L (ref <18)
Troponin I (High Sensitivity): 3 ng/L (ref <18)

## 2022-01-06 LAB — POC URINE PREG, ED: Preg Test, Ur: NEGATIVE

## 2022-01-06 MED ORDER — POTASSIUM CHLORIDE CRYS ER 20 MEQ PO TBCR
EXTENDED_RELEASE_TABLET | ORAL | Status: AC
  Administered 2022-01-06: 40 meq via ORAL
  Filled 2022-01-06: qty 2

## 2022-01-06 MED ORDER — SODIUM CHLORIDE 0.9 % IV BOLUS
1000.0000 mL | Freq: Once | INTRAVENOUS | Status: AC
Start: 2022-01-06 — End: 2022-01-06
  Administered 2022-01-06: 1000 mL via INTRAVENOUS

## 2022-01-06 MED ORDER — POTASSIUM CHLORIDE CRYS ER 20 MEQ PO TBCR: 40.0000 meq | EXTENDED_RELEASE_TABLET | Freq: Once | ORAL | Status: AC

## 2022-01-06 MED ORDER — CEPHALEXIN 500 MG PO CAPS: 500.0000 mg | ORAL_CAPSULE | Freq: Two times a day (BID) | ORAL | 0 refills | Status: AC

## 2022-01-06 NOTE — ED Triage Notes (Signed)
Pt in with co dizziness since this am, no loc. NO hx of the same states started at 0800 today.

## 2022-01-06 NOTE — ED Provider Notes (Signed)
[facility] Provider Note    Event Date/Time   First MD Initiated Contact with Patient 01/06/22 1106     (approximate)   History   Dizziness   HPI  Meredith Roberts is a 51 y.o. female  with episode of sweating and dizzyness around 830AM. She did not fall or faint. She was able to rest and recover. Denies this ever happening previously.  No vision changes, sob, chest pain or nausea or vomiting. Reports some HTN which she takes atenolol. Did not take this morning.  She reports missing breakfast and then having some water and crackles and feeling better.   Physical Exam   Triage Vital Signs: ED Triage Vitals [01/06/22 1045]  Enc Vitals Group     BP (!) 147/91     Pulse Rate (!) 111     Resp 20     Temp 98.1 F (36.7 C)     Temp Source Oral     SpO2 100 %     Weight 145 lb (65.8 kg)     Height 5\' 1"  (1.549 m)     Head Circumference      Peak Flow      Pain Score 0     Pain Loc      Pain Edu?      Excl. in GC?     Most recent vital signs: Vitals:   01/06/22 1045  BP: (!) 147/91  Pulse: (!) 111  Resp: 20  Temp: 98.1 F (36.7 C)  SpO2: 100%     General: Awake, no distress.  CV:  Good peripheral perfusion.  Resp:  Normal effort.  Abd:  No distention.  Soft and nontender Other:  Cranial nerves II to XII are intact.  Finger-nose intact bilaterally. No swelling in her legs.  No calf tenderness  ED Results / Procedures / Treatments   Labs (all labs ordered are listed, but only abnormal results are displayed) Labs Reviewed  CBC WITH DIFFERENTIAL/PLATELET  COMPREHENSIVE METABOLIC PANEL  URINALYSIS, ROUTINE W REFLEX MICROSCOPIC  TROPONIN I (HIGH SENSITIVITY)     EKG  My interpretation of EKG:  Sinus tachy rate of 108, no st elevation, no twi, normal intervals   RADIOLOGY none  PROCEDURES:  Critical Care performed: No  Procedures   MEDICATIONS ORDERED IN ED: Medications  sodium chloride 0.9 % bolus 1,000 mL (1,000 mLs  Intravenous New Bag/Given 01/06/22 1141)  potassium chloride SA (KLOR-CON M) CR tablet 40 mEq (40 mEq Oral Given 01/06/22 1225)     IMPRESSION / MDM / ASSESSMENT AND PLAN / ED COURSE  I reviewed the triage vital signs and the nursing notes.   Patient's presentation is most consistent with acute, uncomplicated illness.   Patient comes in with tachycardia with near syncopal symptoms. Differential:  EKG, cardiac markers to evaluate for ACS, arrhythmia, electrolyte abnormalities, UTI.  Her cranial nerves appear intact and she is good finger-to-nose and doubt that this is represents a stroke.  This could also just be dehydration given she reports not eating or drinking this morning prior to going to work.  She denies any calf tenderness or shortness of breath to suggest PE  Patient given a liter of fluid for tachycardia and some potassium for low potassium  Tropes negative x2.  UA concerning for UTI she does report some increased urinary frequency.  We will send for culture.  1:09 PM I rechecked the heart rate myself prior to discharge and her heart rate was 94.  She reports resolution of symptoms.  She feels comfortable with discharge home at this time and was sent home with some antibiotics and increasing p.o. intake    FINAL CLINICAL IMPRESSION(S) / ED DIAGNOSES   Final diagnoses:  Dizziness  Dehydration  Acute cystitis without hematuria     Rx / DC Orders   ED Discharge Orders          Ordered    cephALEXin (KEFLEX) 500 MG capsule  2 times daily        01/06/22 1310             Note:  This document was prepared using Dragon voice recognition software and may include unintentional dictation errors.   Concha Se, MD 01/06/22 [phone]

## 2022-01-06 NOTE — Discharge Instructions (Signed)
Make sure to stay well-hydrated and do not miss breakfast in the morning prior to going to work.  Take the antibiotics for UTI and return if you develop fevers, worsening dizziness or any other concerns

## 2022-01-06 NOTE — ED Provider Triage Note (Signed)
Emergency Medicine Provider Triage Evaluation Note  Meredith Roberts , a 51 y.o. female  was evaluated in triage.  Pt complains of dizzy this morning better and drinking and eating. No dizzyness now   Review of Systems  Positive: dizzy Negative: No vision changes   Physical Exam  BP (!) 147/91 (BP Location: Left Arm)   Pulse (!) 111   Temp 98.1 F (36.7 C) (Oral)   Resp 20   SpO2 100%  Gen:   Awake, no distress   Resp:  Normal effort  MSK:   Moves extremities without difficulty  Other:  CN intact. Finger to nose intact   Medical Decision Making  Medically screening exam initiated at 10:46 AM.  Appropriate orders placed.  Chase Caller was informed that the remainder of the evaluation will be completed by another provider, this initial triage assessment does not replace that evaluation, and the importance of remaining in the ED until their evaluation is complete.  NIHSS Zero. Unlikely stroke. Labs     Concha Se, MD 01/06/22 1047

## 2022-01-09 LAB — URINE CULTURE: Culture: >=100000 — AB

## 2022-04-09 IMAGING — CT CT RENAL STONE PROTOCOL
2 of 4 series · 16 of 46 positions shown, 18 images · non-contrast
Comparison: None.

CLINICAL DATA: Lower abdominal pain.



[Series 2: stone full standard · axial · 0.61mm/px · z∈[-865,-480]mm · 13 of 85 slices shown, 15 images]
[im 4/85  soft-tissue]
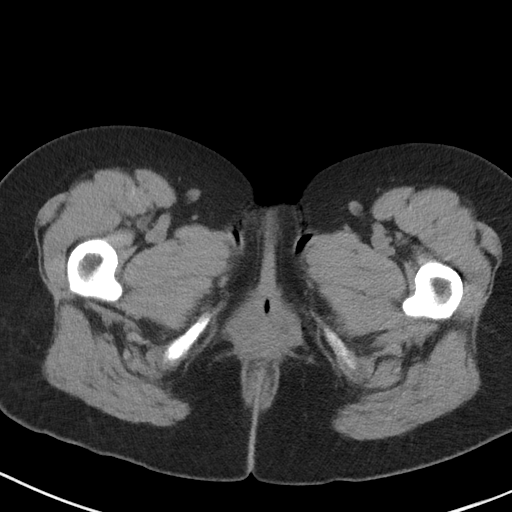
[im 4/85  bone]
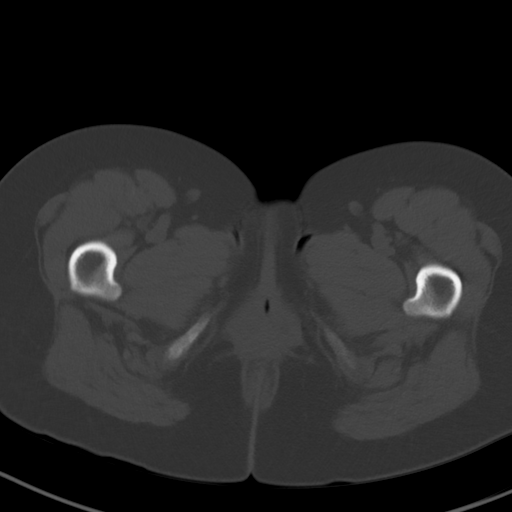
[im 11/85  soft-tissue]
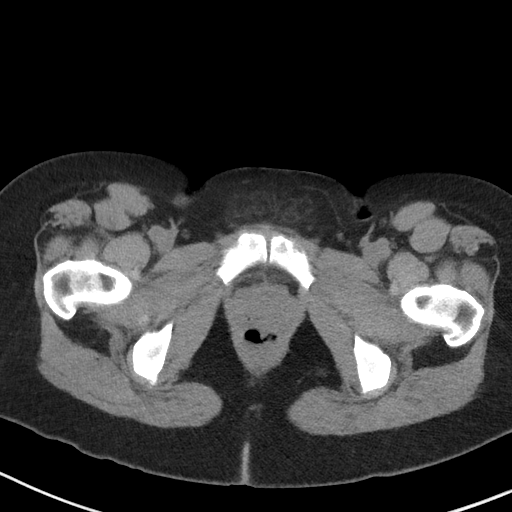
[im 18/85  soft-tissue]
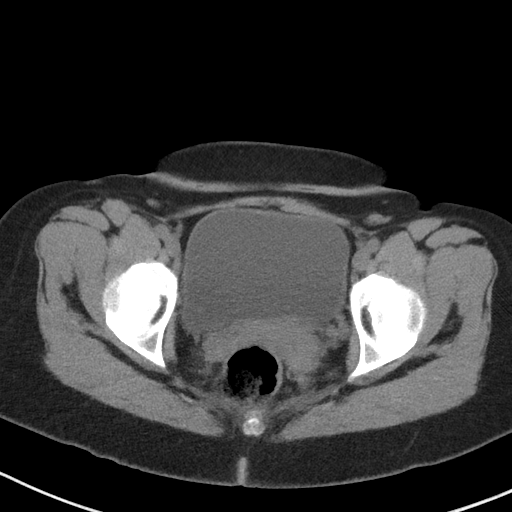
[im 25/85  soft-tissue]
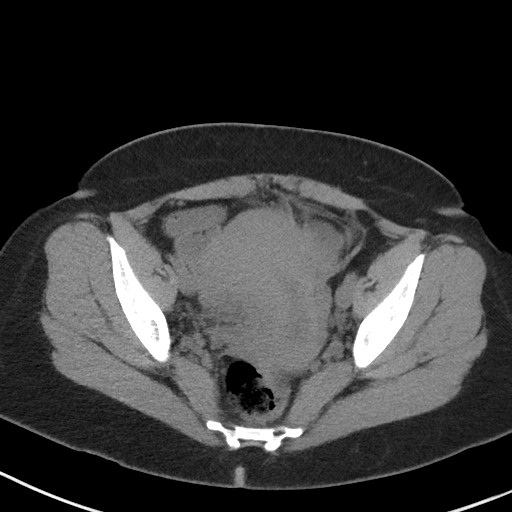
[im 29/85  soft-tissue]
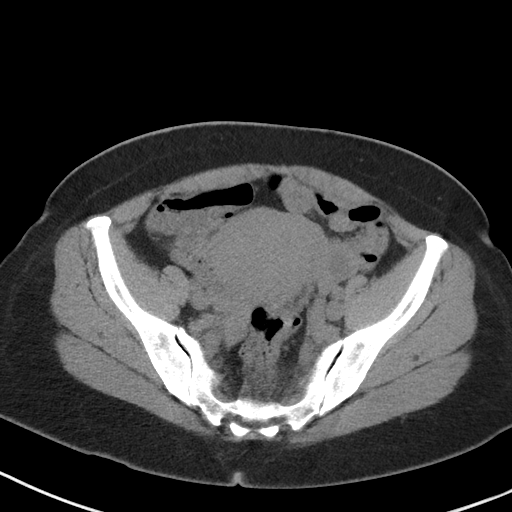
[im 36/85  soft-tissue]
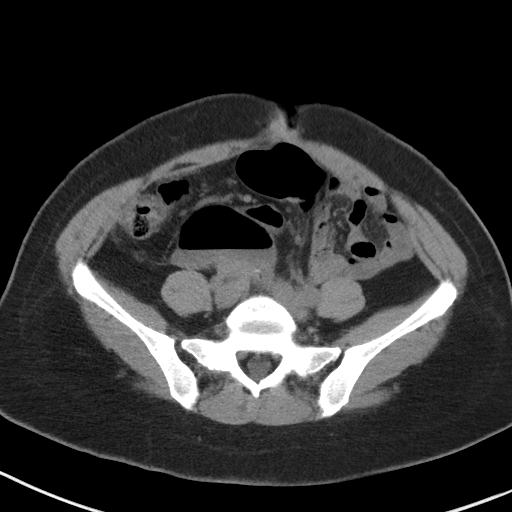
[im 43/85  soft-tissue]
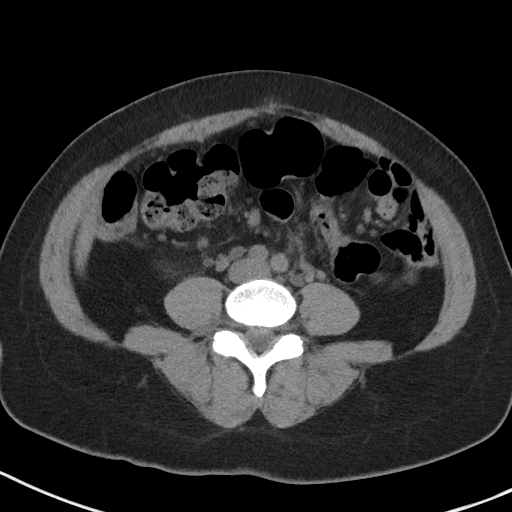
[im 50/85  soft-tissue]
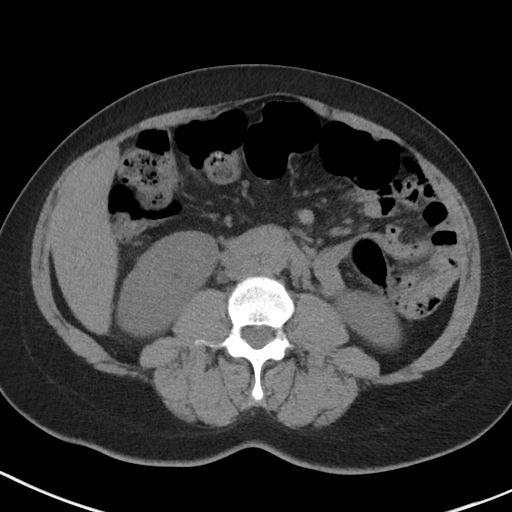
[im 57/85  soft-tissue]
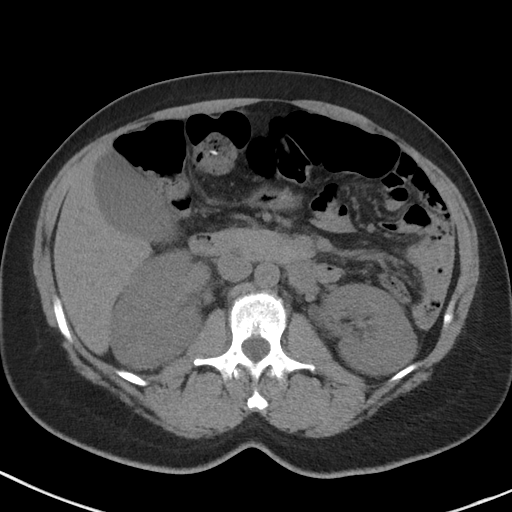
[im 57/85  bone]
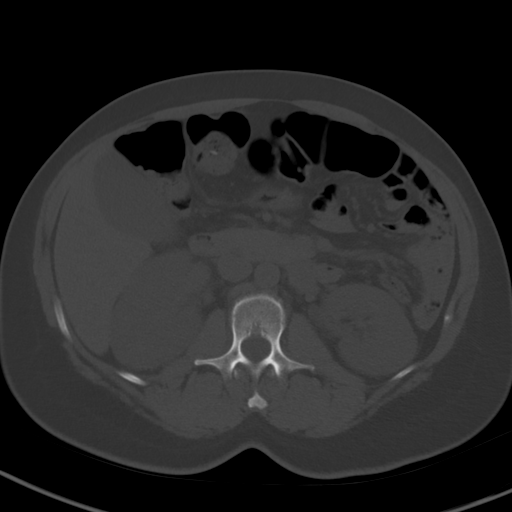
[im 60/85  soft-tissue]
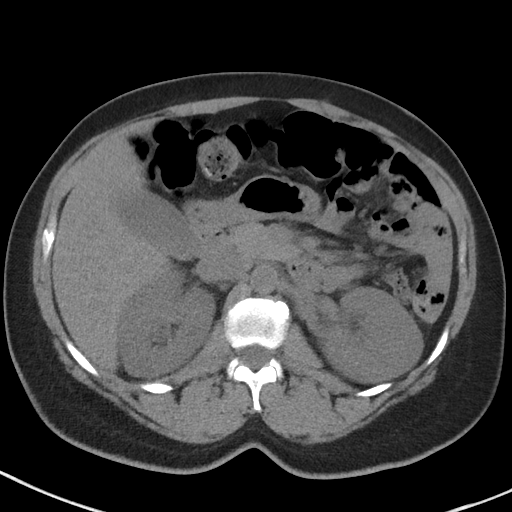
[im 67/85  soft-tissue]
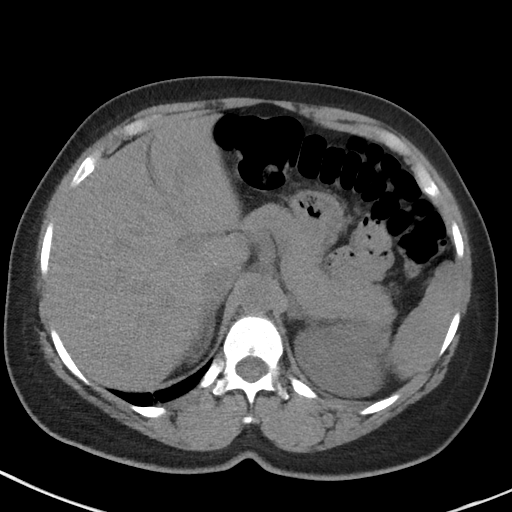
[im 74/85  soft-tissue]
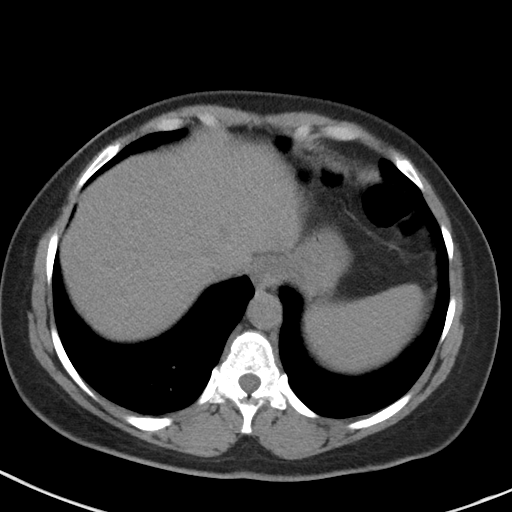
[im 81/85  soft-tissue]
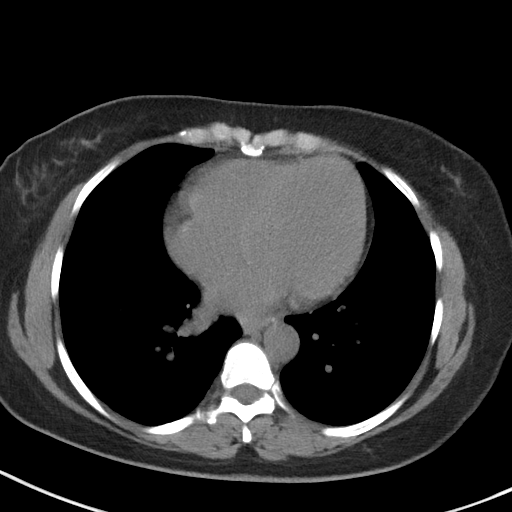

[Series 5: coronal · coronal · 0.67mm/px · 3 of 130 slices shown]
[im 44/130  soft-tissue]
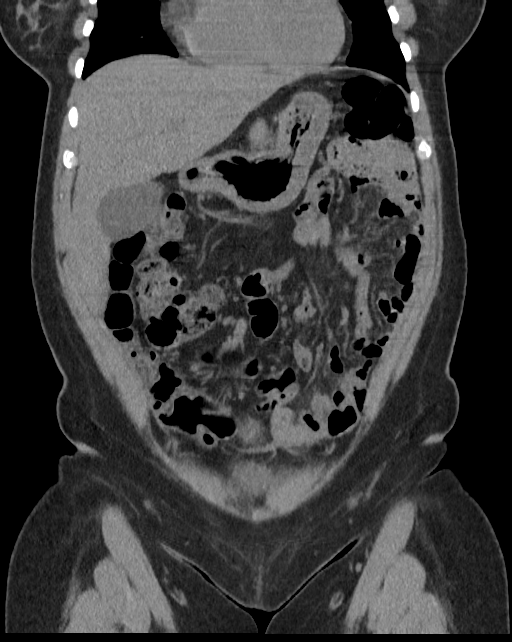
[im 58/130  soft-tissue]
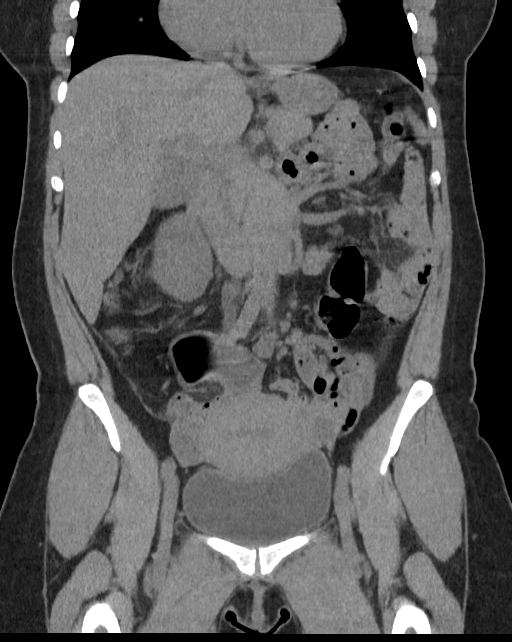
[im 72/130  soft-tissue]
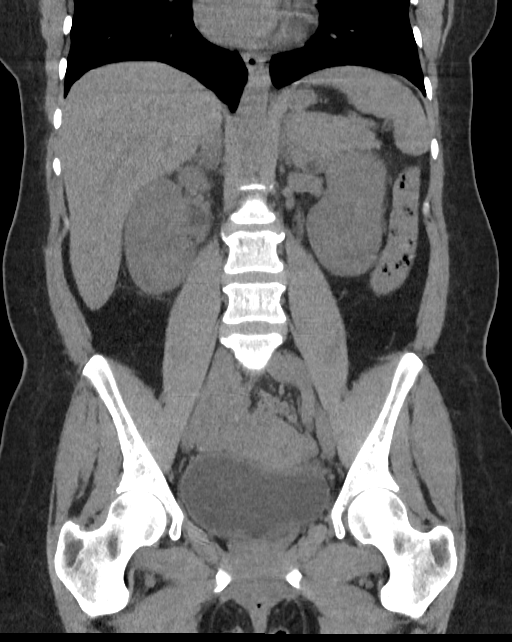

[16 of 46 positions shown; findings below may reference images not displayed]

FINDINGS: Lower chest: The lung bases are clear of acute process. No pleural
effusion or pulmonary lesions. The heart is normal in size. No
pericardial effusion. The distal esophagus and aorta are
unremarkable.

Hepatobiliary: No hepatic lesions or intrahepatic biliary
dilatation. The gallbladder is unremarkable. No common bile duct
dilatation.

Pancreas: No mass, inflammation or ductal dilatation.

Spleen: Normal size.  No focal lesions.

Adrenals/Urinary Tract: Adrenal glands are unremarkable.

No renal or obstructing ureteral calculi.  No bladder calculi.

Stomach/Bowel: The stomach, duodenum, small bowel and colon are
grossly normal. Multiple transverse cecum. The terminal ileum is
unremarkable. The appendix is normal.

Vascular/Lymphatic: The aorta is normal in caliber. No
atheroscerlotic calcifications. No mesenteric of retroperitoneal
mass or adenopathy. Small scattered lymph nodes are noted.

Reproductive: The the uterus and ovaries are grossly normal.

Other: No pelvic mass or adenopathy. No free pelvic fluid
collections. No inguinal mass or adenopathy. No abdominal wall
hernia or subcutaneous lesions.

Musculoskeletal: No significant bony findings.
IMPRESSION: 1. No acute abdominal/pelvic findings, mass lesions or adenopathy.
2. No renal or obstructing ureteral calculi.

## 2022-04-21 ENCOUNTER — Encounter: Payer: Self-pay | Admitting: Emergency Medicine

## 2022-04-21 ENCOUNTER — Emergency Department
Admission: EM | Admit: 2022-04-21 | Discharge: 2022-04-21 | Disposition: A | Discharge status: Home / Self Care | Payer: Self-pay | Attending: Emergency Medicine | Admitting: Emergency Medicine

## 2022-04-21 ENCOUNTER — Other Ambulatory Visit: Payer: Self-pay

## 2022-04-21 ENCOUNTER — Emergency Department: Payer: Medicaid Other

## 2022-04-21 DIAGNOSIS — H538 Other visual disturbances: Secondary | ICD-10-CM | POA: Insufficient documentation

## 2022-04-21 LAB — CBC WITH DIFFERENTIAL/PLATELET
Abs Immature Granulocytes: 0.02 10*3/uL (ref 0.00–0.07)
Basophils Absolute: 0 10*3/uL (ref 0.0–0.1)
Basophils Relative: 0 %
Eosinophils Absolute: 0 10*3/uL (ref 0.0–0.5)
Eosinophils Relative: 0 %
HCT: 35.9 % — ABNORMAL LOW (ref 36.0–46.0)
Hemoglobin: 11.4 g/dL — ABNORMAL LOW (ref 12.0–15.0)
Immature Granulocytes: 0 %
Lymphocytes Relative: 7 %
Lymphs Abs: 0.6 10*3/uL — ABNORMAL LOW (ref 0.7–4.0)
MCH: 23.3 pg — ABNORMAL LOW (ref 26.0–34.0)
MCHC: 31.8 g/dL (ref 30.0–36.0)
MCV: 73.3 fL — ABNORMAL LOW (ref 80.0–100.0)
Monocytes Absolute: 0.4 10*3/uL (ref 0.1–1.0)
Monocytes Relative: 4 %
Neutro Abs: 7.9 10*3/uL — ABNORMAL HIGH (ref 1.7–7.7)
Neutrophils Relative %: 89 %
Platelets: 285 10*3/uL (ref 150–400)
RBC: 4.9 MIL/uL (ref 3.87–5.11)
RDW: 17.8 % — ABNORMAL HIGH (ref 11.5–15.5)
WBC: 8.9 10*3/uL (ref 4.0–10.5)
nRBC: 0 % (ref 0.0–0.2)

## 2022-04-21 LAB — URINALYSIS, ROUTINE W REFLEX MICROSCOPIC
Bilirubin Urine: NEGATIVE
Glucose, UA: NEGATIVE mg/dL
Ketones, ur: 5 mg/dL — AB
Leukocytes,Ua: NEGATIVE
Nitrite: NEGATIVE
Protein, ur: 30 mg/dL — AB
Specific Gravity, Urine: 1.015 (ref 1.005–1.030)
pH: 5 (ref 5.0–8.0)

## 2022-04-21 LAB — BASIC METABOLIC PANEL
Anion gap: 5 (ref 5–15)
BUN: 13 mg/dL (ref 6–20)
CO2: 25 mmol/L (ref 22–32)
Calcium: 9.3 mg/dL (ref 8.9–10.3)
Chloride: 107 mmol/L (ref 98–111)
Creatinine, Ser: 0.5 mg/dL (ref 0.44–1.00)
GFR, Estimated: >60 mL/min (ref >60)
Glucose, Bld: 112 mg/dL — ABNORMAL HIGH (ref 70–99)
Potassium: 4 mmol/L (ref 3.5–5.1)
Sodium: 137 mmol/L (ref 135–145)

## 2022-04-21 NOTE — ED Notes (Signed)
See triage note  Presents with some blurred vision  States she also thought that her b/p was elevated   Denies any trauma

## 2022-04-21 NOTE — ED Triage Notes (Signed)
Pt denies any headache, denies weakness, denies pain. Denies drainage.

## 2022-04-21 NOTE — ED Triage Notes (Signed)
Pt states she had sudden onset blurred vision in both eyes at 9am while working.  Pt stated after resting for a few minutes it resolved. Blurred vision happened again at 1230 and pt states still having blurred vision.

## 2022-04-21 NOTE — ED Provider Notes (Signed)
[facility] Provider Note    Event Date/Time   First MD Initiated Contact with Patient 04/21/22 1420     (approximate)   History   Eye Problem   HPI  Meredith Roberts is a 51 y.o. female   presents to the ED with complaint of sudden onset blurred vision in both her eyes at approximately 9 AM while she was working.  Patient states that she stopped for a while and rested for a few minutes and her blurred vision resolved.  Patient states that it happened again approximately 12:30 PM when she was supposed to have her lunch.  Patient states that she did not get to eat her food.  She denies any headache, dizziness, weakness, nausea or vomiting.  She denies any previous episodes of visual changes in the past.  While talking with her now she states that the visual problems have now resolved again.  She denies any previous hypoglycemia but does report she has a history of hypertension that is controlled.      Physical Exam   Triage Vital Signs: ED Triage Vitals  Enc Vitals Group     BP 04/21/22 1331 136/87     Pulse Rate 04/21/22 1331 87     Resp 04/21/22 1331 18     Temp 04/21/22 1331 98.3 F (36.8 C)     Temp Source 04/21/22 1331 Oral     SpO2 04/21/22 1331 94 %     Weight 04/21/22 1332 145 lb (65.8 kg)     Height 04/21/22 1332 5\' 2"  (1.575 m)     Head Circumference --      Peak Flow --      Pain Score 04/21/22 1332 0     Pain Loc --      Pain Edu? --      Excl. in GC? --     Most recent vital signs: Vitals:   04/21/22 1331 04/21/22 1615  BP: 136/87 (!) 142/91  Pulse: 87 90  Resp: 18 17  Temp: 98.3 F (36.8 C)   SpO2: 94% 100%     General: Awake, no distress.  Alert, talkative. CV:  Good peripheral perfusion.  Heart regular rate and rhythm. Resp:  Normal effort.  Lungs are clear bilaterally. Abd:  No distention.  Other:  PERRLA, EOMI's, conjunctiva clear, cranial nerves II through XII grossly intact.  Speech is normal.  Patient is  ambulatory without any assistance.  Good muscle strength bilaterally at 5/5.  No facial drooping or deficits noted to the face.   ED Results / Procedures / Treatments   Labs (all labs ordered are listed, but only abnormal results are displayed) Labs Reviewed  CBC WITH DIFFERENTIAL/PLATELET - Abnormal; Notable for the following components:      Result Value   Hemoglobin 11.4 (*)    HCT 35.9 (*)    MCV 73.3 (*)    MCH 23.3 (*)    RDW 17.8 (*)    Neutro Abs 7.9 (*)    Lymphs Abs 0.6 (*)    All other components within normal limits  BASIC METABOLIC PANEL - Abnormal; Notable for the following components:   Glucose, Bld 112 (*)    All other components within normal limits  URINALYSIS, ROUTINE W REFLEX MICROSCOPIC - Abnormal; Notable for the following components:   Color, Urine YELLOW (*)    APPearance HAZY (*)    Hgb urine dipstick MODERATE (*)    Ketones, ur 5 (*)  Protein, ur 30 (*)    Bacteria, UA RARE (*)    All other components within normal limits      RADIOLOGY CT scan per radiologist was negative for any acute intracranial changes.    PROCEDURES:  Critical Care performed:   Procedures   MEDICATIONS ORDERED IN ED: Medications - No data to display   IMPRESSION / MDM / [place] / ED COURSE  I reviewed the triage vital signs and the nursing notes.   Differential diagnosis includes, but is not limited to, visual disturbance etiology uncertain, hypoglycemia, possible TIA  51 year old female presents to the ED with complaint of 2 episodes while at work of blurred vision that lasted several minutes not related to any nausea, vomiting, headache, dizziness.  Patient states that currently visual disturbance has completely resolved.  In addition to CT study lab work was reassuring with BMP normal with a nonfasting glucose of 112, WBC 8.9, hemoglobin 11.4 and hematocrit 35.9.  Urinalysis showed 5 ketones with rare bacteria.  Patient was encouraged to increase  fluids.  She has a PCP at [facility] and agrees to go for recheck and follow-up from this visit.  She is return to the emergency department immediately if any urgent concerns or sudden changes including chest pain, shortness of breath, dizziness, headache and worsening of her symptoms.      Patient's presentation is most consistent with acute complicated illness / injury requiring diagnostic workup.  FINAL CLINICAL IMPRESSION(S) / ED DIAGNOSES   Final diagnoses:  Blurred vision, bilateral     Rx / DC Orders   ED Discharge Orders     None        Note:  This document was prepared using Dragon voice recognition software and may include unintentional dictation errors.   Johnn Hai, PA-C 04/21/22 1617    Harvest Dark, MD 04/24/22 2311

## 2022-04-21 NOTE — Discharge Instructions (Addendum)
Follow-up with your primary care provider for any continued problems or urgent concerns.  You should also make an appointment for follow-up from this visit for recheck.

## 2022-07-09 ENCOUNTER — Emergency Department: Payer: Medicaid Other

## 2022-07-09 ENCOUNTER — Emergency Department
Admission: EM | Admit: 2022-07-09 | Discharge: 2022-07-09 | Disposition: A | Discharge status: Home / Self Care | Payer: Medicaid Other | Attending: Emergency Medicine | Admitting: Emergency Medicine

## 2022-07-09 ENCOUNTER — Other Ambulatory Visit: Payer: Self-pay

## 2022-07-09 DIAGNOSIS — N3000 Acute cystitis without hematuria: Secondary | ICD-10-CM | POA: Insufficient documentation

## 2022-07-09 DIAGNOSIS — R109 Unspecified abdominal pain: Secondary | ICD-10-CM

## 2022-07-09 DIAGNOSIS — R1012 Left upper quadrant pain: Secondary | ICD-10-CM | POA: Diagnosis present

## 2022-07-09 LAB — COMPREHENSIVE METABOLIC PANEL
ALT: 13 U/L (ref 0–44)
AST: 16 U/L (ref 15–41)
Albumin: 4 g/dL (ref 3.5–5.0)
Alkaline Phosphatase: 59 U/L (ref 38–126)
Anion gap: 5 (ref 5–15)
BUN: 8 mg/dL (ref 6–20)
CO2: 26 mmol/L (ref 22–32)
Calcium: 9 mg/dL (ref 8.9–10.3)
Chloride: 108 mmol/L (ref 98–111)
Creatinine, Ser: 0.5 mg/dL (ref 0.44–1.00)
GFR, Estimated: >60 mL/min (ref >60)
Glucose, Bld: 98 mg/dL (ref 70–99)
Potassium: 4.2 mmol/L (ref 3.5–5.1)
Sodium: 139 mmol/L (ref 135–145)
Total Bilirubin: 0.5 mg/dL (ref 0.3–1.2)
Total Protein: 8 g/dL (ref 6.5–8.1)

## 2022-07-09 LAB — CBC
HCT: 36 % (ref 36.0–46.0)
Hemoglobin: 11.4 g/dL — ABNORMAL LOW (ref 12.0–15.0)
MCH: 24.1 pg — ABNORMAL LOW (ref 26.0–34.0)
MCHC: 31.7 g/dL (ref 30.0–36.0)
MCV: 76.1 fL — ABNORMAL LOW (ref 80.0–100.0)
Platelets: 263 10*3/uL (ref 150–400)
RBC: 4.73 MIL/uL (ref 3.87–5.11)
RDW: 16.9 % — ABNORMAL HIGH (ref 11.5–15.5)
WBC: 5.6 10*3/uL (ref 4.0–10.5)
nRBC: 0 % (ref 0.0–0.2)

## 2022-07-09 LAB — URINALYSIS, ROUTINE W REFLEX MICROSCOPIC
Bilirubin Urine: NEGATIVE
Glucose, UA: NEGATIVE mg/dL
Ketones, ur: NEGATIVE mg/dL
Nitrite: NEGATIVE
Protein, ur: 30 mg/dL — AB
Specific Gravity, Urine: 1.012 (ref 1.005–1.030)
pH: 7 (ref 5.0–8.0)

## 2022-07-09 LAB — LIPASE, BLOOD: Lipase: 52 U/L — ABNORMAL HIGH (ref 11–51)

## 2022-07-09 LAB — TROPONIN I (HIGH SENSITIVITY)
Troponin I (High Sensitivity): <2 ng/L (ref <18)
Troponin I (High Sensitivity): <2 ng/L (ref <18)

## 2022-07-09 LAB — PREGNANCY, URINE: Preg Test, Ur: NEGATIVE

## 2022-07-09 MED ORDER — ACETAMINOPHEN 500 MG PO TABS
1000.0000 mg | ORAL_TABLET | Freq: Once | ORAL | Status: AC
  Administered 2022-07-09: 1000 mg via ORAL
  Filled 2022-07-09: qty 2

## 2022-07-09 MED ORDER — IOHEXOL 300 MG/ML  SOLN
100.0000 mL | Freq: Once | INTRAMUSCULAR | Status: AC | PRN
  Administered 2022-07-09: 100 mL via INTRAVENOUS

## 2022-07-09 MED ORDER — CEPHALEXIN 500 MG PO CAPS: 500.0000 mg | ORAL_CAPSULE | Freq: Two times a day (BID) | ORAL | 0 refills | Status: AC

## 2022-07-09 NOTE — ED Provider Notes (Addendum)
[facility] Provider Note    Event Date/Time   First MD Initiated Contact with Patient 07/09/22 1002     (approximate)   History   Abdominal Pain   HPI  Meredith Roberts is a 51 y.o. female with history of abdominal surgery secondary to abdominal mass, then had another surgery for an incisional hernia repair who comes in with concerns for abdominal pain.  Patient reports left upper abdominal discomfort that started today.  Denies any associated symptoms with it.  She denies any chest pain, shortness of breath or other concerns but given the pain she decided to come in today.  She denies any vaginal discharge.  She denies any falls or hitting her head or other concerns.  Physical Exam   Triage Vital Signs: ED Triage Vitals  Enc Vitals Group     BP 07/09/22 0916 (!) 158/106     Pulse Rate 07/09/22 0916 96     Resp 07/09/22 0916 18     Temp 07/09/22 0916 97.7 F (36.5 C)     Temp Source 07/09/22 0916 Oral     SpO2 07/09/22 0916 99 %     Weight 07/09/22 0917 135 lb (61.2 kg)     Height 07/09/22 0917 5\' 1"  (1.549 m)     Head Circumference --      Peak Flow --      Pain Score 07/09/22 0916 8     Pain Loc --      Pain Edu? --      Excl. in GC? --     Most recent vital signs: Vitals:   07/09/22 0916  BP: (!) 158/106  Pulse: 96  Resp: 18  Temp: 97.7 F (36.5 C)  SpO2: 99%     General: Awake, no distress.  CV:  Good peripheral perfusion.  Resp:  Normal effort.  Abd:  No distention.  Slight tenderness in the left upper to mid abdomen.  Patient has prior abdominal scars noted.   ED Results / Procedures / Treatments   Labs (all labs ordered are listed, but only abnormal results are displayed) Labs Reviewed  LIPASE, BLOOD - Abnormal; Notable for the following components:      Result Value   Lipase 52 (*)    All other components within normal limits  CBC - Abnormal; Notable for the following components:   Hemoglobin 11.4 (*)    MCV 76.1  (*)    MCH 24.1 (*)    RDW 16.9 (*)    All other components within normal limits  URINALYSIS, ROUTINE W REFLEX MICROSCOPIC - Abnormal; Notable for the following components:   Color, Urine YELLOW (*)    APPearance HAZY (*)    Hgb urine dipstick MODERATE (*)    Protein, ur 30 (*)    Leukocytes,Ua SMALL (*)    Bacteria, UA MANY (*)    All other components within normal limits  COMPREHENSIVE METABOLIC PANEL  PREGNANCY, URINE  TROPONIN I (HIGH SENSITIVITY)     EKG  My interpretation of EKG:  Normal sinus rhythm 92 without any ST elevation, T wave version lead III, normal intervals  RADIOLOGY I have reviewed the CT personally and interpreted and no mass  PROCEDURES:  Critical Care performed: No  Procedures   MEDICATIONS ORDERED IN ED: Medications - No data to display   IMPRESSION / MDM / ASSESSMENT AND PLAN / ED COURSE  I reviewed the triage vital signs and the nursing notes.   Patient's  presentation is most consistent with acute presentation with potential threat to life or bodily function.   Patient comes in with left-sided abdominal pain in the setting of prior abdominal mass.  Will get a CT scan just to ensure there is no evidence of any abdominal mass.  Lipase was slightly elevated.  CMP was reassuring patient has no pain over her gallbladder.  CBC shows stable hemoglobin no white count elevation.  Urine does look concerning for possible UTI will send for urine culture but at this time she denies any urinary symptoms.  CT scan was reassuring.  Will get repeat troponin given onset of pain was this morning.  But on reevaluation patient reports pain is now resolved with the Tylenol.  Considered admission but given negative CT scan and negative troponins x 2 and patient is now resolved patient can be discharged  We discussed reassuring results monitoring symptoms at home and return to the ER if she develops worsening symptoms or any other concerns.  Will cover for  possible UTI    FINAL CLINICAL IMPRESSION(S) / ED DIAGNOSES   Final diagnoses:  Acute cystitis without hematuria  Abdominal pain, unspecified abdominal location     Rx / DC Orders   ED Discharge Orders          Ordered    cephALEXin (KEFLEX) 500 MG capsule  2 times daily        07/09/22 1235             Note:  This document was prepared using Dragon voice recognition software and may include unintentional dictation errors.   Concha Se, MD 07/09/22 1235    Concha Se, MD 07/09/22 1236

## 2022-07-09 NOTE — Discharge Instructions (Addendum)
Take Tylenol 1 g every 8 hours to help with your pain.  Take the antibiotics for possible UTI.  Your CT scan was reassuring.  Your blood pressure was elevated and you should have this rechecked with your primary care doctor  To the ER for fevers, worsening pain or any other concerns  IMPRESSION: 1. No acute findings within the abdomen or pelvis.

## 2022-07-09 NOTE — ED Triage Notes (Signed)
Pt reports epigastric pain that started this morning. Pt denies nausea, vomiting and diarrhea.

## 2022-07-11 LAB — URINE CULTURE: Culture: >=100000 — AB

## 2022-07-21 IMAGING — CT CT HEAD W/O CM
4 series · 17 of 47 positions shown, 19 images · non-contrast
Comparison: None.

CLINICAL DATA: Worsening headache since [REDACTED].



[Series 2: head wo · axial · 0.39mm/px · z∈[-138,-34]mm · 7 of 29 slices shown, 9 images]
[im 4/29  brain]
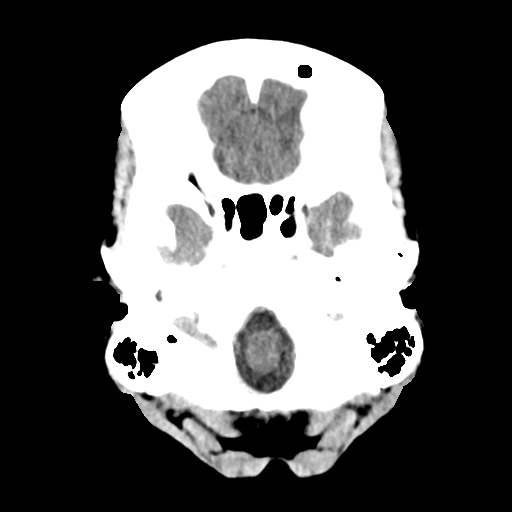
[im 4/29  bone]
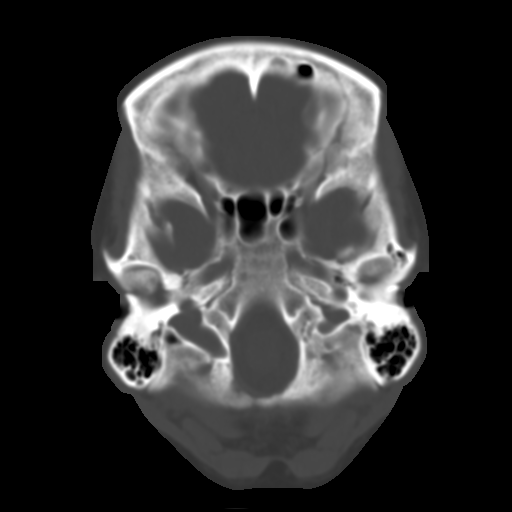
[im 8/29  brain]
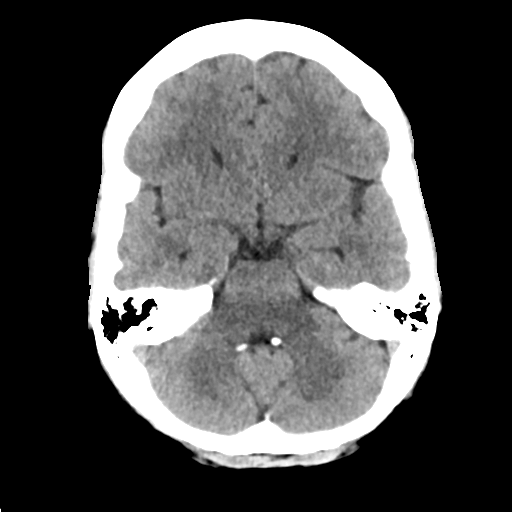
[im 11/29  brain]
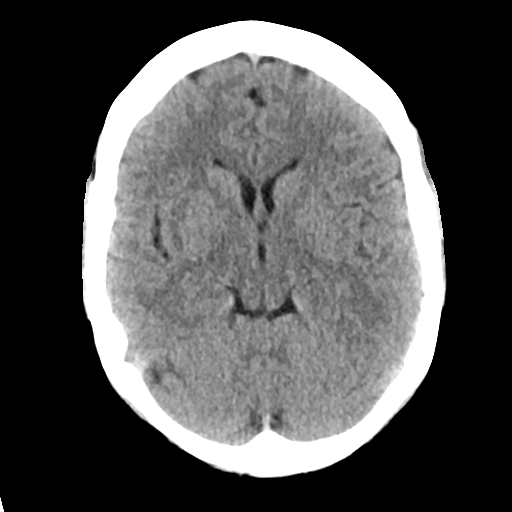
[im 15/29  brain]
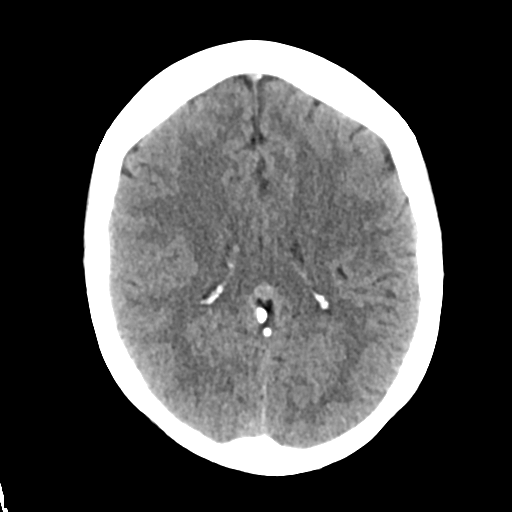
[im 18/29  brain]
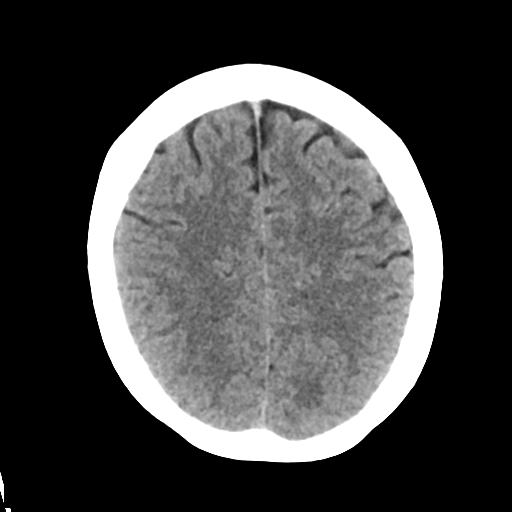
[im 18/29  bone]
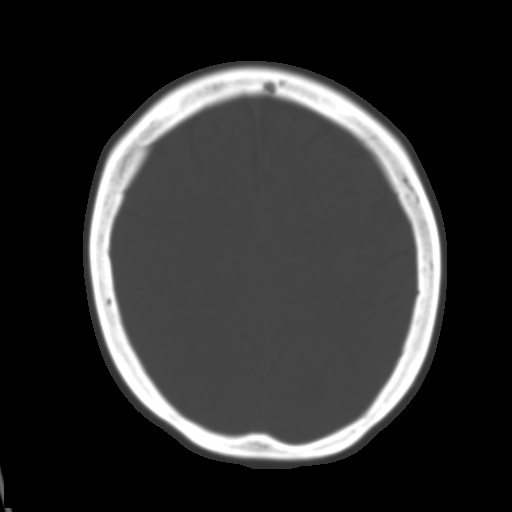
[im 22/29  brain]
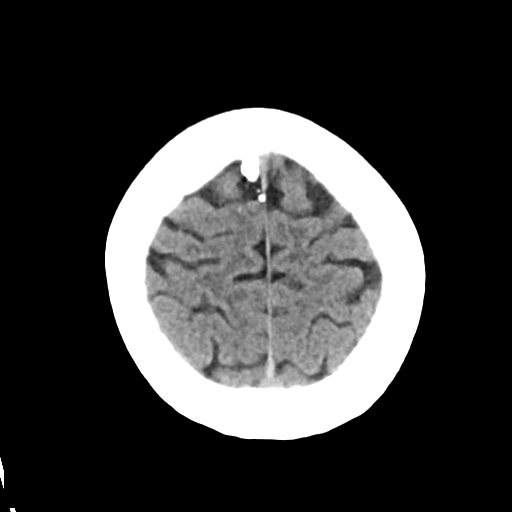
[im 25/29  brain]
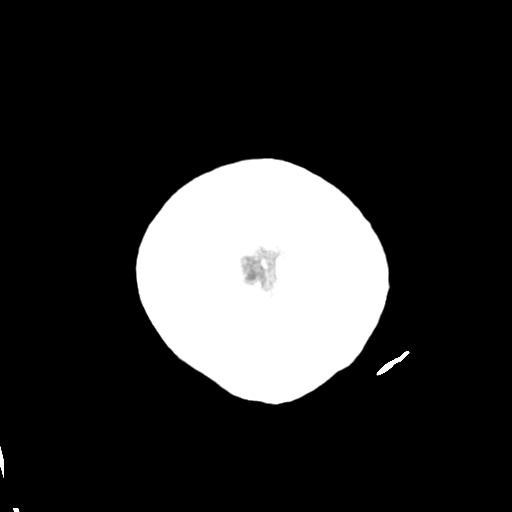

[Series 3: head bone · axial · 0.39mm/px · z∈[-140,-92]mm · 4 of 72 slices shown]
[im 8/72  bone]
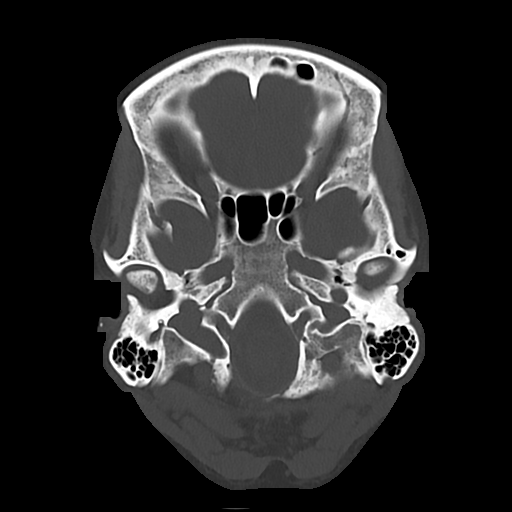
[im 15/72  bone]
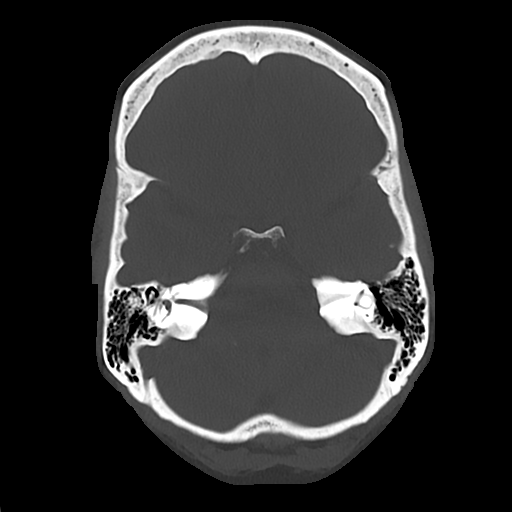
[im 22/72  bone]
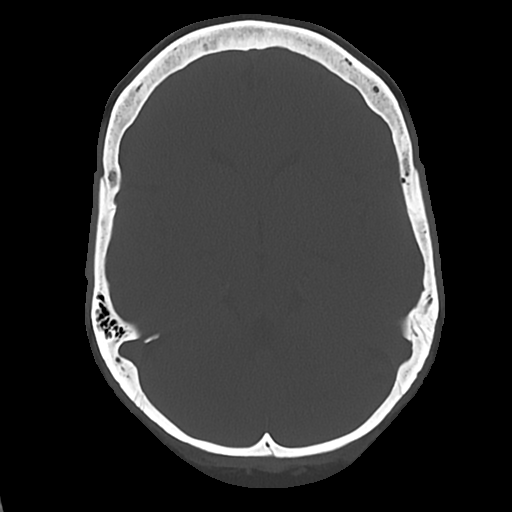
[im 32/72  bone]
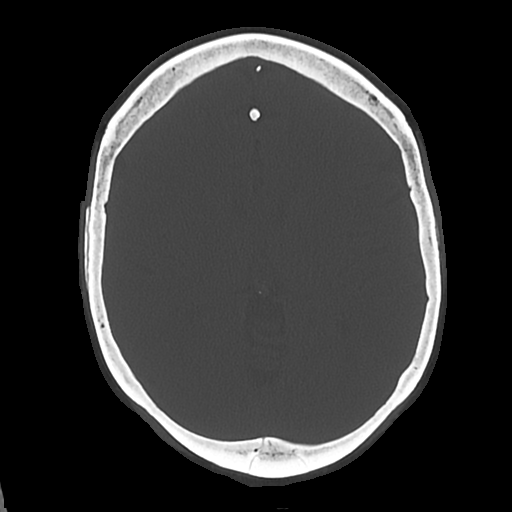

[Series 4: cor soft · coronal · 0.32mm/px · 3 of 65 slices shown]
[im 22/65  brain]
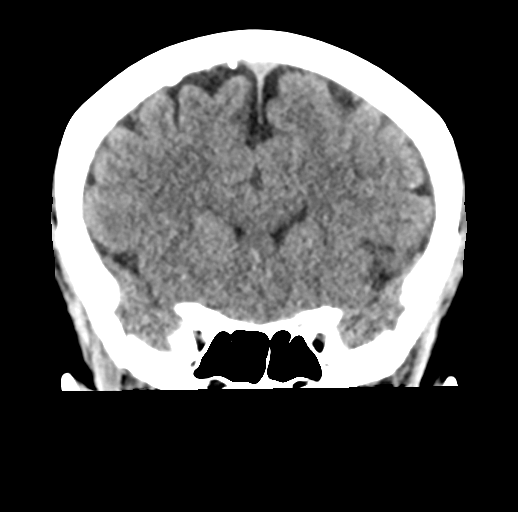
[im 29/65  brain]
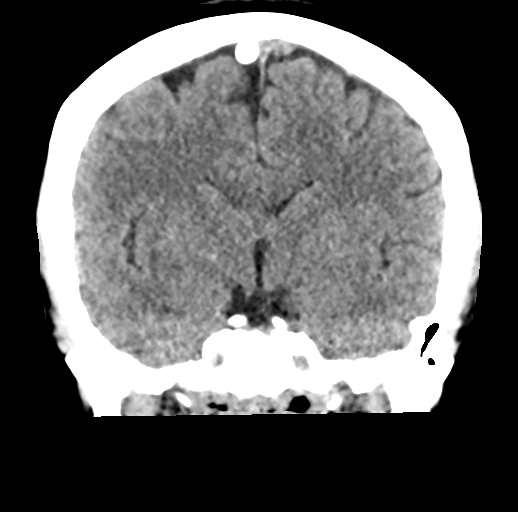
[im 36/65  brain]
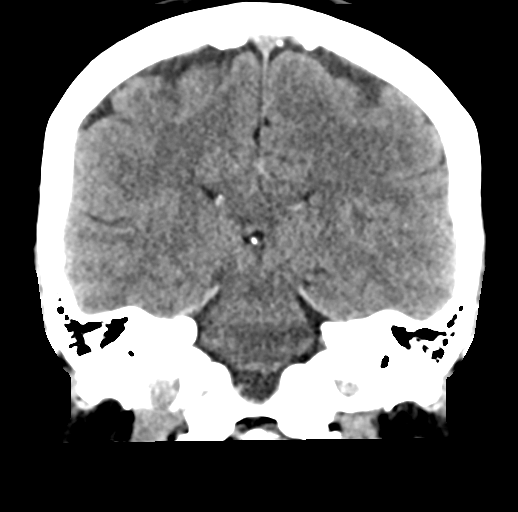

[Series 5: sag soft · sagittal · 0.32mm/px · 3 of 55 slices shown]
[im 19/55  brain]
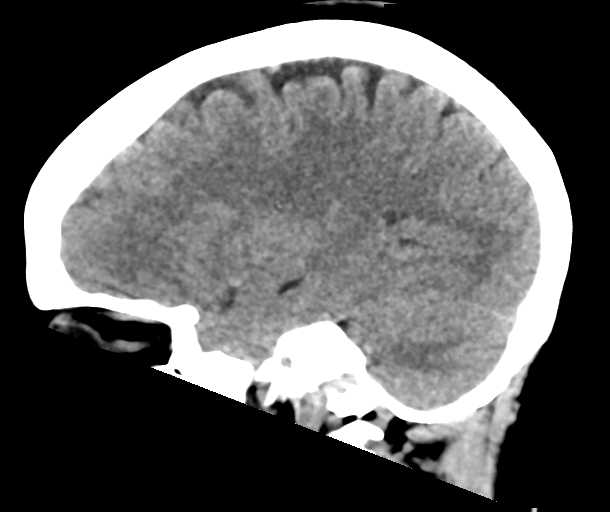
[im 28/55  brain]
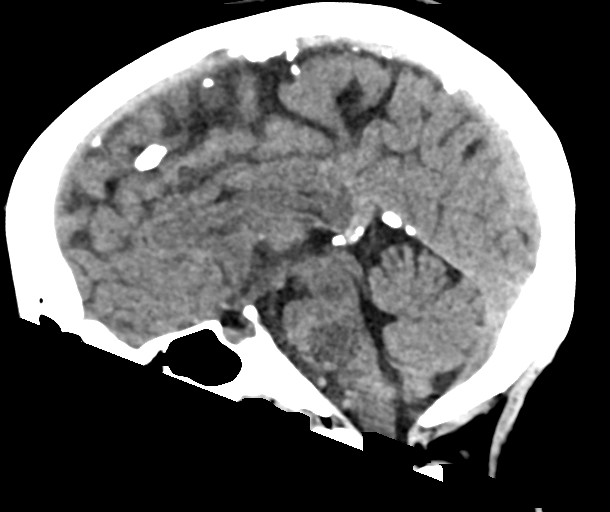
[im 37/55  brain]
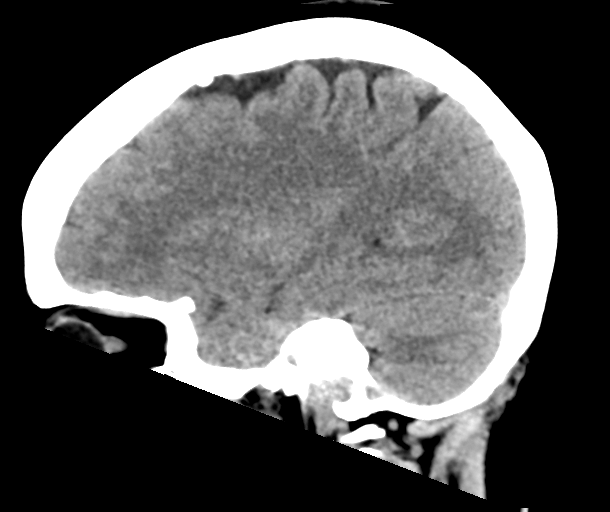

[17 of 47 positions shown; findings below may reference images not displayed]

FINDINGS: Brain: No evidence of acute infarction, hemorrhage, hydrocephalus,
extra-axial collection or mass lesion/mass effect.

Vascular: No hyperdense vessel is noted

Skull: Normal. Negative for fracture or focal lesion.

Sinuses/Orbits: No acute finding.

Other: None.
IMPRESSION: No acute intracranial abnormality identified.

## 2023-06-08 ENCOUNTER — Other Ambulatory Visit: Payer: Self-pay | Admitting: Family Medicine

## 2023-06-08 DIAGNOSIS — Z1231 Encounter for screening mammogram for malignant neoplasm of breast: Secondary | ICD-10-CM

## 2023-07-10 ENCOUNTER — Emergency Department: Payer: Medicaid Other

## 2023-07-10 ENCOUNTER — Emergency Department
Admission: EM | Admit: 2023-07-10 | Discharge: 2023-07-10 | Disposition: A | Discharge status: Home / Self Care | Payer: Self-pay | Attending: Emergency Medicine | Admitting: Emergency Medicine

## 2023-07-10 ENCOUNTER — Other Ambulatory Visit: Payer: Self-pay

## 2023-07-10 DIAGNOSIS — K449 Diaphragmatic hernia without obstruction or gangrene: Secondary | ICD-10-CM

## 2023-07-10 DIAGNOSIS — K639 Disease of intestine, unspecified: Secondary | ICD-10-CM

## 2023-07-10 DIAGNOSIS — K6389 Other specified diseases of intestine: Secondary | ICD-10-CM | POA: Insufficient documentation

## 2023-07-10 LAB — COMPREHENSIVE METABOLIC PANEL
ALT: 13 U/L (ref 0–44)
AST: 17 U/L (ref 15–41)
Albumin: 3.8 g/dL (ref 3.5–5.0)
Alkaline Phosphatase: 63 U/L (ref 38–126)
Anion gap: 7 (ref 5–15)
BUN: 10 mg/dL (ref 6–20)
CO2: 24 mmol/L (ref 22–32)
Calcium: 9 mg/dL (ref 8.9–10.3)
Chloride: 104 mmol/L (ref 98–111)
Creatinine, Ser: 0.73 mg/dL (ref 0.44–1.00)
GFR, Estimated: >60 mL/min (ref >60)
Glucose, Bld: 103 mg/dL — ABNORMAL HIGH (ref 70–99)
Potassium: 3.5 mmol/L (ref 3.5–5.1)
Sodium: 135 mmol/L (ref 135–145)
Total Bilirubin: 0.4 mg/dL (ref <1.2)
Total Protein: 7.7 g/dL (ref 6.5–8.1)

## 2023-07-10 LAB — URINALYSIS, ROUTINE W REFLEX MICROSCOPIC
Bilirubin Urine: NEGATIVE
Glucose, UA: NEGATIVE mg/dL
Ketones, ur: NEGATIVE mg/dL
Nitrite: NEGATIVE
Protein, ur: NEGATIVE mg/dL
Specific Gravity, Urine: 1.01 (ref 1.005–1.030)
WBC, UA: >50 WBC/hpf (ref 0–5)
pH: 7 (ref 5.0–8.0)

## 2023-07-10 LAB — CBC
HCT: 34.2 % — ABNORMAL LOW (ref 36.0–46.0)
Hemoglobin: 11.3 g/dL — ABNORMAL LOW (ref 12.0–15.0)
MCH: 25.9 pg — ABNORMAL LOW (ref 26.0–34.0)
MCHC: 33 g/dL (ref 30.0–36.0)
MCV: 78.3 fL — ABNORMAL LOW (ref 80.0–100.0)
Platelets: 333 10*3/uL (ref 150–400)
RBC: 4.37 MIL/uL (ref 3.87–5.11)
RDW: 14.7 % (ref 11.5–15.5)
WBC: 6.3 10*3/uL (ref 4.0–10.5)
nRBC: 0 % (ref 0.0–0.2)

## 2023-07-10 LAB — LIPASE, BLOOD: Lipase: 49 U/L (ref 11–51)

## 2023-07-10 LAB — TROPONIN I (HIGH SENSITIVITY): Troponin I (High Sensitivity): 3 ng/L (ref <18)

## 2023-07-10 LAB — POC URINE PREG, ED: Preg Test, Ur: NEGATIVE

## 2023-07-10 LAB — HCG, QUANTITATIVE, PREGNANCY: hCG, Beta Chain, Quant, S: <1 m[IU]/mL (ref <5)

## 2023-07-10 MED ORDER — IOHEXOL 300 MG/ML  SOLN
100.0000 mL | Freq: Once | INTRAMUSCULAR | Status: AC | PRN
  Administered 2023-07-10: 100 mL via INTRAVENOUS

## 2023-07-10 MED ORDER — CEPHALEXIN 500 MG PO CAPS: 500.0000 mg | ORAL_CAPSULE | Freq: Two times a day (BID) | ORAL | 0 refills | Status: AC

## 2023-07-10 MED ORDER — PANTOPRAZOLE SODIUM 40 MG IV SOLR
40.0000 mg | Freq: Once | INTRAVENOUS | Status: AC
  Administered 2023-07-10: 40 mg via INTRAVENOUS
  Filled 2023-07-10: qty 10

## 2023-07-10 MED ORDER — ACETAMINOPHEN 500 MG PO TABS
1000.0000 mg | ORAL_TABLET | Freq: Once | ORAL | Status: AC
  Administered 2023-07-10: 1000 mg via ORAL
  Filled 2023-07-10: qty 2

## 2023-07-10 MED ORDER — PANTOPRAZOLE SODIUM 20 MG PO TBEC: 20.0000 mg | DELAYED_RELEASE_TABLET | Freq: Every day | ORAL | 0 refills | Status: AC

## 2023-07-10 MED ORDER — ALUM & MAG HYDROXIDE-SIMETH 200-200-20 MG/5ML PO SUSP
30.0000 mL | Freq: Once | ORAL | Status: AC
  Administered 2023-07-10: 30 mL via ORAL
  Filled 2023-07-10: qty 30

## 2023-07-10 NOTE — Discharge Instructions (Addendum)
You can take Tylenol 1 g every 8 hours I have started you on acid reducer in case this could be some reflux as well as a antibiotic for possible UTI.  Return to the ER if develop worsening symptoms or any other concerns.  Please call GI your primary care doctor to get an outpatient colonoscopy scheduled.  Return to the ER if you are unable to tolerate p.o. or any other concerns   IMPRESSION: Scattered colonic stool. No bowel obstruction. Several redundant loops of colon. There is slightly nodular area of wall thickening and caliber change along the sigmoid colon of uncertain etiology and significance. This could be peristalsis although a true mass lesion is in the differential. Recommend dedicated colonic workup when able to exclude underlying lesion.   Uterine fibroids.   Small hiatal hernia.

## 2023-07-10 NOTE — ED Triage Notes (Signed)
Pt to ED via POV from home. Pt reports umbilical pain that started today. Pt denies N/V/D. Pt denies urinary symptoms. Pt reports hx of HTN and has been med compliant.

## 2023-07-10 NOTE — ED Provider Notes (Signed)
[facility] Provider Note    Event Date/Time   First MD Initiated Contact with Patient 07/10/23 1532     (approximate)   History   Abdominal Pain   HPI  Meredith Roberts is a 52 y.o. female with history of abdominal surgery secondary abdominal mass, incisional hernia repair who comes in with concerns for abdominal pain.  Patient reports pain at her umbilicus.  Has been constant since this morning at 8 AM but has gotten worse over the past few hours.  Has not taken anything to try to help with it.  She denies any nausea vomiting.  Still passing gas.  She denies ever having anything like this previously.  Denies any chest pain, shortness of breath.   Physical Exam   Triage Vital Signs: ED Triage Vitals [07/10/23 1334]  Encounter Vitals Group     BP (!) 177/98     Systolic BP Percentile      Diastolic BP Percentile      Pulse Rate 100     Resp 18     Temp 98.7 F (37.1 C)     Temp Source Oral     SpO2 98 %     Weight      Height      Head Circumference      Peak Flow      Pain Score 10     Pain Loc      Pain Education      Exclude from Growth Chart     Most recent vital signs: Vitals:   07/10/23 1334  BP: (!) 177/98  Pulse: 100  Resp: 18  Temp: 98.7 F (37.1 C)  SpO2: 98%     General: Awake, no distress.  CV:  Good peripheral perfusion.  Resp:  Normal effort.  Abd:  No distention.  Tender at her umbilicus area.  No rebound, no guarding.  No right upper quadrant tenderness. Other:     ED Results / Procedures / Treatments   Labs (all labs ordered are listed, but only abnormal results are displayed) Labs Reviewed  COMPREHENSIVE METABOLIC PANEL - Abnormal; Notable for the following components:      Result Value   Glucose, Bld 103 (*)    All other components within normal limits  CBC - Abnormal; Notable for the following components:   Hemoglobin 11.3 (*)    HCT 34.2 (*)    MCV 78.3 (*)    MCH 25.9 (*)    All other components  within normal limits  LIPASE, BLOOD  URINALYSIS, ROUTINE W REFLEX MICROSCOPIC  POC URINE PREG, ED     EKG  My interpretation of EKG:  Normal sinus rate of 88 without any ST elevation or T wave inversions, normal intervals  RADIOLOGY I have reviewed the CT personally interpreted and no kidney stones.  PROCEDURES:  Critical Care performed: No  Procedures   MEDICATIONS ORDERED IN ED: Medications  acetaminophen (TYLENOL) tablet 1,000 mg (1,000 mg Oral Given 07/10/23 1619)  alum & mag hydroxide-simeth (MAALOX/MYLANTA) 200-200-20 MG/5ML suspension 30 mL (30 mLs Oral Given 07/10/23 1619)  pantoprazole (PROTONIX) injection 40 mg (40 mg Intravenous Given 07/10/23 1623)  iohexol (OMNIPAQUE) 300 MG/ML solution 100 mL (100 mLs Intravenous Contrast Given 07/10/23 1705)     IMPRESSION / MDM / ASSESSMENT AND PLAN / ED COURSE  I reviewed the triage vital signs and the nursing notes.   Patient's presentation is most consistent with acute presentation with potential threat to  life or bodily function.   Patient comes in with pain at her umbilicus.  Given her surgical history will get CT imaging to evaluate for obstruction, perforation, mass, appendicitis.  She got no significant right upper quadrant pain or epigastric pain to suggest gallstones.  Will treat patient's pain with Tylenol, GI cocktail, Protonix.  CBC shows normal white count hemoglobin is stable.  Lipase normal.  CMP reassuring.  Lipase normal. Trop negative   IMPRESSION: Scattered colonic stool. No bowel obstruction. Several redundant loops of colon. There is slightly nodular area of wall thickening and caliber change along the sigmoid colon of uncertain etiology and significance. This could be peristalsis although a true mass lesion is in the differential. Recommend dedicated colonic workup when able to exclude underlying lesion.   Uterine fibroids.   Small hiatal hernia.    Repeat evaluation patient reports feeling  better.  Patient does have small hiatal hernia her urine looks concerning for potential UTI.  We discussed the concern for some thickening of her colon.  Discussed with Dr. Chales Abrahams and he just meant that patient probably needs an outpatient colonoscopy.  Discussed this with patient and she expressed understanding that we want a rule out any type of mass or cancer.  She expressed understanding.  Will discharge patient on reassessment she is well-appearing feels comfortable with discharge home   The patient is on the cardiac monitor to evaluate for evidence of arrhythmia and/or significant heart rate changes.      FINAL CLINICAL IMPRESSION(S) / ED DIAGNOSES   Final diagnoses:  Hiatal hernia  Colonic thickening     Rx / DC Orders   ED Discharge Orders     None        Note:  This document was prepared using Dragon voice recognition software and may include unintentional dictation errors.   Concha Se, MD 07/10/23 [phone]

## 2023-07-12 LAB — URINE CULTURE

## 2023-08-02 ENCOUNTER — Emergency Department: Payer: Self-pay

## 2023-08-02 ENCOUNTER — Emergency Department
Admission: EM | Admit: 2023-08-02 | Discharge: 2023-08-02 | Disposition: A | Discharge status: Home / Self Care | Payer: Self-pay | Attending: Student in an Organized Health Care Education/Training Program | Admitting: Student in an Organized Health Care Education/Training Program

## 2023-08-02 ENCOUNTER — Encounter: Payer: Self-pay | Admitting: Emergency Medicine

## 2023-08-02 ENCOUNTER — Other Ambulatory Visit: Payer: Self-pay

## 2023-08-02 DIAGNOSIS — I159 Secondary hypertension, unspecified: Secondary | ICD-10-CM | POA: Insufficient documentation

## 2023-08-02 DIAGNOSIS — I10 Essential (primary) hypertension: Secondary | ICD-10-CM | POA: Insufficient documentation

## 2023-08-02 DIAGNOSIS — R519 Headache, unspecified: Secondary | ICD-10-CM

## 2023-08-02 LAB — BASIC METABOLIC PANEL
Anion gap: 10 (ref 5–15)
BUN: 11 mg/dL (ref 6–20)
CO2: 22 mmol/L (ref 22–32)
Calcium: 9.1 mg/dL (ref 8.9–10.3)
Chloride: 107 mmol/L (ref 98–111)
Creatinine, Ser: 0.66 mg/dL (ref 0.44–1.00)
GFR, Estimated: >60 mL/min (ref >60)
Glucose, Bld: 97 mg/dL (ref 70–99)
Potassium: 3.8 mmol/L (ref 3.5–5.1)
Sodium: 139 mmol/L (ref 135–145)

## 2023-08-02 LAB — CBC
HCT: 35.8 % — ABNORMAL LOW (ref 36.0–46.0)
Hemoglobin: 11.9 g/dL — ABNORMAL LOW (ref 12.0–15.0)
MCH: 25.9 pg — ABNORMAL LOW (ref 26.0–34.0)
MCHC: 33.2 g/dL (ref 30.0–36.0)
MCV: 78 fL — ABNORMAL LOW (ref 80.0–100.0)
Platelets: 367 10*3/uL (ref 150–400)
RBC: 4.59 MIL/uL (ref 3.87–5.11)
RDW: 14.6 % (ref 11.5–15.5)
WBC: 5.9 10*3/uL (ref 4.0–10.5)
nRBC: 0.7 % — ABNORMAL HIGH (ref 0.0–0.2)

## 2023-08-02 MED ORDER — ACETAMINOPHEN 500 MG PO TABS
1000.0000 mg | ORAL_TABLET | Freq: Once | ORAL | Status: AC
  Administered 2023-08-02: 1000 mg via ORAL
  Filled 2023-08-02: qty 2

## 2023-08-02 MED ORDER — AMLODIPINE BESYLATE 5 MG PO TABS
5.0000 mg | ORAL_TABLET | Freq: Once | ORAL | Status: AC
  Administered 2023-08-02: 5 mg via ORAL
  Filled 2023-08-02: qty 1

## 2023-08-02 MED ORDER — AMLODIPINE BESYLATE 5 MG PO TABS: 5.0000 mg | ORAL_TABLET | Freq: Every day | ORAL | 11 refills | Status: AC

## 2023-08-02 NOTE — ED Triage Notes (Signed)
Pt via POV from home. Pt c/o headache since last night. Reports that she has a hx of HTN and has been taking medication as prescribed. Pt is A&Ox4 and NAD, ambulatory to triage.

## 2023-08-02 NOTE — ED Notes (Signed)
Pt reports that she has had a headache since this morning. Pt states that her BP is high. Pt states that she does take BP meds. Pt has not missed her meds.

## 2023-08-02 NOTE — Discharge Instructions (Signed)

## 2023-08-02 NOTE — ED Provider Notes (Signed)
[facility] Provider Note    Event Date/Time   First MD Initiated Contact with Patient 08/02/23 1632     (approximate)   History   Headache and Hypertension   HPI  Meredith Roberts is a 52 y.o. female With a history tension presents to the ER for evaluation of headache starting last night around 7 or 8:00.  Denies any associated numbness or tingling no blurry vision no chest pain or shortness of breath.  No back pain.  She is on 1 blood pressure medication and states that she has been compliant with.  Denies any neck stiffness no fevers or chills.  This was not sudden onset headache.  Not the worst headache of her life.  She currently denies any significant discomfort after having taken Tylenol earlier this morning.  Has a mild disc comfort in her head but presented to the ER mostly concern for elevated blood pressure.      Physical Exam   Triage Vital Signs: ED Triage Vitals  Encounter Vitals Group     BP 08/02/23 1209 (!) 167/103     Systolic BP Percentile --      Diastolic BP Percentile --      Pulse Rate 08/02/23 1209 99     Resp 08/02/23 1209 16     Temp 08/02/23 1209 98.2 F (36.8 C)     Temp Source 08/02/23 1209 Oral     SpO2 08/02/23 1209 99 %     Weight 08/02/23 1209 138 lb 3.2 oz (62.7 kg)     Height 08/02/23 1209 5\' 5"  (1.651 m)     Head Circumference --      Peak Flow --      Pain Score 08/02/23 1212 10     Pain Loc --      Pain Education --      Exclude from Growth Chart --     Most recent vital signs: Vitals:   08/02/23 1209 08/02/23 1632  BP: (!) 167/103 (!) 183/105  Pulse: 99 (!) 104  Resp: 16 16  Temp: 98.2 F (36.8 C)   SpO2: 99% 99%     Constitutional: Alert  Eyes: Conjunctivae are normal.  Head: Atraumatic. Nose: No congestion/rhinnorhea. Mouth/Throat: Mucous membranes are moist.   Neck: Painless ROM.  Cardiovascular:   Good peripheral circulation. Respiratory: Normal respiratory effort.  No retractions.   Gastrointestinal: Soft and nontender.  Musculoskeletal:  no deformity Neurologic:  MAE spontaneously. No gross focal neurologic deficits are appreciated.  Skin:  Skin is warm, dry and intact. No rash noted. Psychiatric: Mood and affect are normal. Speech and behavior are normal.    ED Results / Procedures / Treatments   Labs (all labs ordered are listed, but only abnormal results are displayed) Labs Reviewed  CBC - Abnormal; Notable for the following components:      Result Value   Hemoglobin 11.9 (*)    HCT 35.8 (*)    MCV 78.0 (*)    MCH 25.9 (*)    nRBC 0.7 (*)    All other components within normal limits  BASIC METABOLIC PANEL     EKG     RADIOLOGY Please see ED Course for my review and interpretation.  I personally reviewed all radiographic images ordered to evaluate for the above acute complaints and reviewed radiology reports and findings.  These findings were personally discussed with the patient.  Please see medical record for radiology report.    PROCEDURES:  Critical  Care performed: No  Procedures   MEDICATIONS ORDERED IN ED: Medications  acetaminophen (TYLENOL) tablet 1,000 mg (1,000 mg Oral Given 08/02/23 1642)  amLODipine (NORVASC) tablet 5 mg (5 mg Oral Given 08/02/23 1643)     IMPRESSION / MDM / ASSESSMENT AND PLAN / ED COURSE  I reviewed the triage vital signs and the nursing notes.                              Differential diagnosis includes, but is not limited to, sdh, iph, migraine, tension, sinus headache, cluster  Patient presenting to the ER for evaluation of symptoms as described above.  Based on symptoms, risk factors and considered above differential, this presenting complaint could reflect a potentially life-threatening illness therefore the patient will be placed on continuous pulse oximetry and telemetry for monitoring.  Laboratory evaluation will be sent to evaluate for the above complaints.      Clinical Course as of  08/02/23 1738  Sun Aug 02, 2023  1712 CT head on my review and interpretation without evidence of IPH. [PR]  1736 Patient reassessed.  Repeat blood pressure now 160/90.  She is asymptomatic at this time.  I do not feel that further diagnostic testing clinically indicated at this time.  Given her elevated blood pressures will place on Norvasc as she seems to respond well to this she does have outpatient follow-up. [PR]    Clinical Course User Index [PR] Willy Eddy, MD     FINAL CLINICAL IMPRESSION(S) / ED DIAGNOSES   Final diagnoses:  Secondary hypertension  Acute nonintractable headache, unspecified headache type     Rx / DC Orders   ED Discharge Orders          Ordered    amLODipine (NORVASC) 5 MG tablet  Daily        08/02/23 1738             Note:  This document was prepared using Dragon voice recognition software and may include unintentional dictation errors.    Willy Eddy, MD 08/02/23 [phone]
